# Patient Record
Sex: Female | Born: 1985
Health system: Southern US, Community
[De-identification: ages and names within clinical notes are randomized; demographics above are authoritative.]

## PROBLEM LIST (undated history)

## (undated) DIAGNOSIS — G971 Other reaction to spinal and lumbar puncture: Secondary | ICD-10-CM

## (undated) DIAGNOSIS — F419 Anxiety disorder, unspecified: Secondary | ICD-10-CM

## (undated) DIAGNOSIS — K589 Irritable bowel syndrome without diarrhea: Secondary | ICD-10-CM

## (undated) DIAGNOSIS — R87629 Unspecified abnormal cytological findings in specimens from vagina: Secondary | ICD-10-CM

## (undated) DIAGNOSIS — M329 Systemic lupus erythematosus, unspecified: Secondary | ICD-10-CM

## (undated) DIAGNOSIS — IMO0002 Reserved for concepts with insufficient information to code with codable children: Secondary | ICD-10-CM

## (undated) DIAGNOSIS — E079 Disorder of thyroid, unspecified: Secondary | ICD-10-CM

## (undated) DIAGNOSIS — E039 Hypothyroidism, unspecified: Secondary | ICD-10-CM

## (undated) HISTORY — PX: LEEP: SHX91

## (undated) HISTORY — DX: Unspecified abnormal cytological findings in specimens from vagina: R87.629

---

## 2007-05-01 ENCOUNTER — Ambulatory Visit (HOSPITAL_COMMUNITY): Admission: RE | Admit: 2007-05-01 | Discharge: 2007-05-01 | Payer: Self-pay | Admitting: Obstetrics and Gynecology

## 2007-08-21 ENCOUNTER — Inpatient Hospital Stay (HOSPITAL_COMMUNITY): Admission: AD | Admit: 2007-08-21 | Discharge: 2007-08-21 | Payer: Self-pay | Admitting: Obstetrics and Gynecology

## 2007-08-27 ENCOUNTER — Inpatient Hospital Stay (HOSPITAL_COMMUNITY): Admission: AD | Admit: 2007-08-27 | Discharge: 2007-08-27 | Payer: Self-pay | Admitting: Obstetrics and Gynecology

## 2007-11-14 ENCOUNTER — Inpatient Hospital Stay (HOSPITAL_COMMUNITY): Admission: AD | Admit: 2007-11-14 | Discharge: 2007-11-15 | Payer: Self-pay | Admitting: Obstetrics and Gynecology

## 2007-11-20 ENCOUNTER — Inpatient Hospital Stay (HOSPITAL_COMMUNITY): Admission: AD | Admit: 2007-11-20 | Discharge: 2007-11-23 | Payer: Self-pay | Admitting: Obstetrics and Gynecology

## 2010-11-12 ENCOUNTER — Encounter: Payer: Self-pay | Admitting: Obstetrics and Gynecology

## 2011-03-06 NOTE — H&P (Signed)
[name], [name]               ACCOUNT NO.:  [id number]   MEDICAL RECORD NO.:  [id number]          PATIENT TYPE:  INP   LOCATION:  9142                          FACILITY:  [facility]   PHYSICIAN:  Janine Limbo, M.D.DATE OF BIRTH:  1986-02-23   DATE OF ADMISSION:  11/20/2007  DATE OF DISCHARGE:                              HISTORY & PHYSICAL   This is a 25 year old gravida 2, para 0-1-0-0 at 40-4/7 weeks who  presents for induction of labor secondary to [facility].  Blood pressures were  140-160/90s in the office.  Pregnancy has been followed by the nurse  midwife service and remarkable for:  1. Smoker.  2. Rh negative.  3. First trimester UTI.  4. Migraines.  5. Group B strep positive.   ALLERGIES:  None.   OB HISTORY:  Remarkable for a spontaneous abortion in 2007.   PAST MEDICAL HISTORY:  Remarkable for childhood Varicella.  A history of  Trichomonas in the past.  History of migraines and depression, for which  she no longer uses medications.  Cigarette smoking.   PAST SURGICAL HISTORY:  Negative.   FAMILY HISTORY:  Remarkable for a father with hypertension.  Mother with  asthma.  Grandmother with diabetes.  Mother with lupus.  Mother with  depression.   GENETIC HISTORY:  Remarkable for father of the baby's grandmother with  missing digits.  Patient's mother with a hole in her heart.  A family  history of twins.   SOCIAL HISTORY:  Patient is married to [facility], who is involved and  supportive.  She is of the [place] faith.  She denies any alcohol,  tobacco, or drug use.   PRENATAL LABS:  Hemoglobin 12.7, platelets 239.  Blood type A negative-.  Antibody screen negative.  RPR nonreactive.  Rubella immune.  Hepatitis  negative.  HIV negative.  Pap test negative.  Gonorrhea negative.  Chlamydia negative.  Cystic fibrosis negative.   HISTORY OF CURRENT PREGNANCY:  Patient entered care at [redacted] weeks  gestation.  She had an ultrasound at 18 weeks that was normal.   Glucola  was normal.  Ultrasound done at 30 weeks, which was normal.  Group B  strep was positive at term.  She presents today with hypertension.   OBJECTIVE DATA:  Vital signs stable.  Afebrile.  Blood pressure is  150/90.  HEENT:  Within normal limits.  Thyroid normal, not enlarged.  CHEST:  Clear to auscultation.  HEART:  Regular rate and rhythm.  ABDOMEN:  Gravid.  Vertex Leopold's.  EFM shows reactive fetal heart  rate with occasional contractions.  Cervix was 280, -2, and vertex in the office, reflecting a Bishop's  score of 8.  EXTREMITIES:  Edema 1+ with normal DTRs.   PIH labs are pending.   ASSESSMENT:  1. Intrauterine pregnancy at 40-4/7 weeks.  2. Post dates.  3. Pregnancy-induced hypertension.  4. Favorable cervix.   PLAN:  1. Admit per Dr. Stefano Ward.  2. Routine CNM orders.  3. Pitocin induction of labor.   Risks and benefits were reviewed with the patient.      [name]  [name], C.N.M.      Janine Limbo, M.D.  Electronically Signed    MLW/MEDQ  D:  11/20/2007  T:  11/21/2007  Job:  [id number]

## 2011-07-12 LAB — COMPREHENSIVE METABOLIC PANEL
ALT: 11
AST: 18
Albumin: 2.5 — ABNORMAL LOW
Alkaline Phosphatase: 84
Calcium: 9.2
GFR calc Af Amer: >60
Potassium: 4.5
Sodium: 136
Total Protein: 6.1

## 2011-07-12 LAB — URINALYSIS, ROUTINE W REFLEX MICROSCOPIC
Bilirubin Urine: NEGATIVE
Ketones, ur: 15 — AB
Nitrite: NEGATIVE
Specific Gravity, Urine: 1.025
Urobilinogen, UA: 0.2
pH: 6

## 2011-07-12 LAB — CBC
HCT: 30.2 — ABNORMAL LOW
Hemoglobin: 10.5 — ABNORMAL LOW
MCHC: 34.3
MCV: 90.4
RBC: 3.34 — ABNORMAL LOW
RBC: 4.32
RDW: 14.3
WBC: 10.9 — ABNORMAL HIGH

## 2011-07-31 LAB — URINALYSIS, ROUTINE W REFLEX MICROSCOPIC
Glucose, UA: NEGATIVE
Hgb urine dipstick: NEGATIVE
Ketones, ur: NEGATIVE
pH: 6

## 2011-07-31 LAB — WET PREP, GENITAL
Trich, Wet Prep: NONE SEEN
Yeast Wet Prep HPF POC: NONE SEEN

## 2011-08-01 LAB — RH IMMUNE GLOBULIN WORKUP (NOT WOMEN'S HOSP): Antibody Screen: NEGATIVE

## 2011-08-02 ENCOUNTER — Other Ambulatory Visit (HOSPITAL_COMMUNITY): Payer: Self-pay | Admitting: Family Medicine

## 2011-08-02 ENCOUNTER — Ambulatory Visit (HOSPITAL_COMMUNITY)
Admission: RE | Admit: 2011-08-02 | Discharge: 2011-08-02 | Disposition: A | Discharge status: Home / Self Care | Payer: BC Managed Care – PPO | Source: Ambulatory Visit | Attending: Family Medicine | Admitting: Family Medicine

## 2011-08-02 DIAGNOSIS — Z975 Presence of (intrauterine) contraceptive device: Secondary | ICD-10-CM | POA: Insufficient documentation

## 2011-08-02 DIAGNOSIS — N949 Unspecified condition associated with female genital organs and menstrual cycle: Secondary | ICD-10-CM | POA: Insufficient documentation

## 2012-07-28 ENCOUNTER — Encounter (INDEPENDENT_AMBULATORY_CARE_PROVIDER_SITE_OTHER): Payer: Self-pay | Admitting: *Deleted

## 2012-08-18 ENCOUNTER — Ambulatory Visit (INDEPENDENT_AMBULATORY_CARE_PROVIDER_SITE_OTHER): Payer: BC Managed Care – PPO | Admitting: Internal Medicine

## 2012-08-25 ENCOUNTER — Telehealth: Payer: Self-pay

## 2012-08-25 ENCOUNTER — Ambulatory Visit (INDEPENDENT_AMBULATORY_CARE_PROVIDER_SITE_OTHER): Payer: BC Managed Care – PPO | Admitting: Obstetrics and Gynecology

## 2012-08-25 ENCOUNTER — Encounter: Payer: Self-pay | Admitting: Obstetrics and Gynecology

## 2012-08-25 VITALS — BP 122/78 | Temp 98.7°F | Ht 63.0 in | Wt 193.0 lb

## 2012-08-25 DIAGNOSIS — R102 Pelvic and perineal pain: Secondary | ICD-10-CM

## 2012-08-25 DIAGNOSIS — N926 Irregular menstruation, unspecified: Secondary | ICD-10-CM

## 2012-08-25 DIAGNOSIS — N949 Unspecified condition associated with female genital organs and menstrual cycle: Secondary | ICD-10-CM

## 2012-08-25 NOTE — Progress Notes (Signed)
HISTORY OF PRESENT ILLNESS  Ms. [name] is a 26 y.o. year old female,No obstetric history on file., who presents for a problem visit. The patient has had a Mirena IUD since 2009.  It is due to be removed next year.  She has been taking ciprofloxacin and Azo-Standard for what she thinks is urinary tract infection.  She has irregular cycles.  Subjective:  The patient complains of a vague pelvic discomfort that occurs even when she is not on her period.  This is better than prior to her IUD.  Her cycles are less heavy than prior to her IUD.  She complains of dyspareunia.  She has had some burning with urination.  Objective:  BP 122/78  Temp 98.7 F (37.1 C) (Oral)  Ht 5\' 3"  (1.6 m)  Wt 193 lb (87.544 kg)  BMI 34.19 kg/m2   General: no distress GI: soft and nontender  External genitalia: normal general appearance Vaginal: normal without tenderness, induration or masses and relaxation noted Cervix: normal appearance and IUD string visualized Adnexa: normal bimanual exam Uterus: normal size and consistency  Urine pregnancy test: Negative  Assessment:  Pelvic pain Dysuria Dyspareunia Irregular cycles Need for contraception  Plan:  The patient was told to complete her course of ciprofloxacin.  We will check a urine culture when she returns. Contraceptive options reviewed.  Risk and benefits discussed.  Patient will decide.  She was told to consider having another child if that is what she wants to do. Nexplanon information given.  Return to office in 2 week(s).   Leonard Schwartz M.D.  08/25/2012 7:10 PM    Vag. Discharge:no Odor:yes Fever:no Irreg.Periods:no Dyspareunia:no Dysuria:yes Frequency:yes Urgency:yes Hematuria:no Kidney stones:no Constipation:no Diarrhea:no Rectal Bleeding: no Vomiting:no Nausea:no Pregnant:no Fibroids:no Endometriosis:no Hx of Ovarian Cyst:yes Hx IUD:yes "Mirena" Hx STD-PID:no Appendectomy:no Gall Bladder  Dz:no

## 2012-08-25 NOTE — Telephone Encounter (Signed)
Pt already has appt today

## 2012-08-25 NOTE — Telephone Encounter (Signed)
Spoke to pt who thinks her IUD may be falling out. She has pain and pressure and cannot feel her strings. Pt also thinks she may have UTI sx's starting. Appt booked for today with Dr. Stefano Gaul. Melody Comas A

## 2012-12-15 ENCOUNTER — Encounter: Payer: BC Managed Care – PPO | Admitting: Obstetrics and Gynecology

## 2012-12-17 ENCOUNTER — Encounter: Payer: Self-pay | Admitting: Obstetrics and Gynecology

## 2012-12-17 ENCOUNTER — Ambulatory Visit: Payer: BC Managed Care – PPO | Admitting: Obstetrics and Gynecology

## 2012-12-17 ENCOUNTER — Telehealth: Payer: Self-pay | Admitting: Obstetrics and Gynecology

## 2012-12-17 VITALS — BP 112/72 | Wt 203.0 lb

## 2012-12-17 DIAGNOSIS — Z309 Encounter for contraceptive management, unspecified: Secondary | ICD-10-CM

## 2012-12-17 MED ORDER — AMBULATORY NON FORMULARY MEDICATION: 600.0000 mg | Status: DC

## 2012-12-17 NOTE — Telephone Encounter (Signed)
[name] called to [facility] in [place]. Pt aware.

## 2012-12-17 NOTE — Progress Notes (Signed)
26 YO for IUD removal and replacement contraception.  would also like to be checked for yeast.  O: Abdomen: soft, non-tender without masses  Pelvic: EGBUS-wnl, vagina-normal, cervix- IUD removed without difficulty, no lesions and without tenderness, uterus-normal size and adnexae-no tenderness or masses  Wet Prep:  pH-5  whiff-negative,  no clue, trich or yeast  A:  IUD Removal      Shift in Vaginal pH  P: Yaz  #1 1 po qd with BUM x 1 month starting with next menses 11 refills       BCP instruction sheet given       Boric Acid Suppositories 600 mg 1 pv twice a week x 4 weeks then prn 11 refills       RTO-as scheduled or prn  Ally Knodel, PA-C

## 2014-05-11 ENCOUNTER — Encounter (INDEPENDENT_AMBULATORY_CARE_PROVIDER_SITE_OTHER): Payer: Self-pay | Admitting: *Deleted

## 2014-06-15 ENCOUNTER — Institutional Professional Consult (permissible substitution) (INDEPENDENT_AMBULATORY_CARE_PROVIDER_SITE_OTHER): Payer: BC Managed Care – PPO | Admitting: Internal Medicine

## 2014-06-22 ENCOUNTER — Other Ambulatory Visit (HOSPITAL_COMMUNITY): Payer: Self-pay | Admitting: General Surgery

## 2014-06-22 DIAGNOSIS — R110 Nausea: Secondary | ICD-10-CM

## 2014-06-22 DIAGNOSIS — R1011 Right upper quadrant pain: Secondary | ICD-10-CM

## 2014-06-25 ENCOUNTER — Ambulatory Visit (HOSPITAL_COMMUNITY)
Admission: RE | Admit: 2014-06-25 | Discharge: 2014-06-25 | Disposition: A | Discharge status: Home / Self Care | Payer: BC Managed Care – PPO | Source: Ambulatory Visit | Attending: General Surgery | Admitting: General Surgery

## 2014-06-25 DIAGNOSIS — R110 Nausea: Secondary | ICD-10-CM

## 2014-06-25 DIAGNOSIS — R1011 Right upper quadrant pain: Secondary | ICD-10-CM | POA: Insufficient documentation

## 2014-08-23 ENCOUNTER — Encounter: Payer: Self-pay | Admitting: Obstetrics and Gynecology

## 2014-08-30 ENCOUNTER — Institutional Professional Consult (permissible substitution) (INDEPENDENT_AMBULATORY_CARE_PROVIDER_SITE_OTHER): Payer: BC Managed Care – PPO | Admitting: Internal Medicine

## 2015-12-28 ENCOUNTER — Emergency Department (HOSPITAL_COMMUNITY)
Admission: EM | Admit: 2015-12-28 | Discharge: 2015-12-29 | Disposition: A | Discharge status: Home / Self Care | Payer: BLUE CROSS/BLUE SHIELD | Attending: Emergency Medicine | Admitting: Emergency Medicine

## 2015-12-28 ENCOUNTER — Encounter (HOSPITAL_COMMUNITY): Payer: Self-pay

## 2015-12-28 DIAGNOSIS — Z87891 Personal history of nicotine dependence: Secondary | ICD-10-CM | POA: Insufficient documentation

## 2015-12-28 DIAGNOSIS — M436 Torticollis: Secondary | ICD-10-CM

## 2015-12-28 DIAGNOSIS — G44209 Tension-type headache, unspecified, not intractable: Secondary | ICD-10-CM

## 2015-12-28 DIAGNOSIS — H53149 Visual discomfort, unspecified: Secondary | ICD-10-CM | POA: Insufficient documentation

## 2015-12-28 DIAGNOSIS — R112 Nausea with vomiting, unspecified: Secondary | ICD-10-CM | POA: Insufficient documentation

## 2015-12-28 DIAGNOSIS — R51 Headache: Secondary | ICD-10-CM | POA: Diagnosis present

## 2015-12-28 NOTE — ED Notes (Signed)
Pt c/o pain to both sides of her neck that she states is now causing a headache as well.  Pt went to her pmd this morning and prescribed meds but she has not taken them.  Pt reports pain is worse with movement of her head

## 2015-12-29 MED ORDER — DIPHENHYDRAMINE HCL 50 MG/ML IJ SOLN
25.0000 mg | Freq: Once | INTRAMUSCULAR | Status: AC
  Administered 2015-12-29: 25 mg via INTRAVENOUS
  Filled 2015-12-29: qty 1

## 2015-12-29 MED ORDER — SODIUM CHLORIDE 0.9 % IV SOLN
1000.0000 mL | Freq: Once | INTRAVENOUS | Status: AC
Start: 2015-12-29 — End: 2015-12-29
  Administered 2015-12-29: 1000 mL via INTRAVENOUS

## 2015-12-29 MED ORDER — OXYCODONE-ACETAMINOPHEN 5-325 MG PO TABS: 1.0000 | ORAL_TABLET | ORAL | Status: DC | PRN

## 2015-12-29 MED ORDER — ORPHENADRINE CITRATE ER 100 MG PO TB12: 100.0000 mg | ORAL_TABLET | Freq: Two times a day (BID) | ORAL | Status: DC

## 2015-12-29 MED ORDER — DEXAMETHASONE SODIUM PHOSPHATE 10 MG/ML IJ SOLN
10.0000 mg | Freq: Once | INTRAMUSCULAR | Status: AC
  Administered 2015-12-29: 10 mg via INTRAVENOUS
  Filled 2015-12-29: qty 1

## 2015-12-29 MED ORDER — SODIUM CHLORIDE 0.9 % IV SOLN
1000.0000 mL | INTRAVENOUS | Status: DC
  Administered 2015-12-29: 1000 mL via INTRAVENOUS

## 2015-12-29 MED ORDER — METOCLOPRAMIDE HCL 5 MG/ML IJ SOLN
10.0000 mg | Freq: Once | INTRAMUSCULAR | Status: AC
  Administered 2015-12-29: 10 mg via INTRAVENOUS
  Filled 2015-12-29: qty 2

## 2015-12-29 NOTE — ED Provider Notes (Signed)
CSN: [id number]     Arrival date & time 12/28/15  2321 History  By signing my name below, I, Linus GalasMaharshi Patel, attest that this documentation has been prepared under the direction and in the presence of Dione Boozeavid Tajah Noguchi, MD. Electronically Signed: Linus GalasMaharshi Patel, ED Scribe. 12/29/2015. 12:32 AM.   Chief Complaint  Patient presents with  . Neck Pain  . Headache   Patient is a 30 y.o. female presenting with headaches. The history is provided by the patient. No language interpreter was used.  Headache Associated symptoms: nausea, neck pain, photophobia and vomiting   Associated symptoms: no congestion, no dizziness, no fever and no myalgias    HPI Comments: [name] is a 30 y.o. female with no PMHx who presents to the Emergency Department complaining of sharp neck pain  that began 2 days ago. Pt also reports nausea, vomiting, photophobia, and increased sensitivity to sounds and smells. Pt was seen by PCP for left sided neck pain who advised the pt to visit the ED for worsening pain. Since then, the pt has had pain on both sides of her neck that radiates down her back.  Pt rates her pain an 8/10 in severity. Worsening pain with movement. No alleviating factors. PCP prescribed pt medications but she has not taken them. Pt denies any other symptoms at this time.   Pt has a blood patch during her pregnancy.   History reviewed. No pertinent past medical history. History reviewed. No pertinent past surgical history. Family History  Problem Relation Age of Onset  . Hypertension Father    Social History  Substance Use Topics  . Smoking status: Former Games developermoker  . Smokeless tobacco: Never Used  . Alcohol Use: No   OB History    Gravida Para Term Preterm AB TAB SAB Ectopic Multiple Living   1         1     Review of Systems  Constitutional: Negative for fever and chills.  HENT: Negative for congestion and rhinorrhea.   Eyes: Positive for photophobia. Negative for redness and visual disturbance.   Respiratory: Negative for shortness of breath and wheezing.   Cardiovascular: Negative for chest pain and palpitations.  Gastrointestinal: Positive for nausea and vomiting.  Genitourinary: Negative for dysuria and urgency.  Musculoskeletal: Positive for neck pain. Negative for myalgias and arthralgias.  Skin: Negative for pallor and wound.  Neurological: Positive for headaches. Negative for dizziness.      Allergies  Review of patient's allergies indicates no known allergies.  Home Medications   Prior to Admission medications   Medication Sig Start Date End Date Taking? Authorizing Provider  citalopram (CELEXA) 20 MG tablet Take 20 mg by mouth daily.   Yes Historical Provider, MD  drospirenone-ethinyl estradiol (YAZ,GIANVI,LORYNA) 3-0.02 MG tablet Take 1 tablet by mouth daily.   Yes Historical Provider, MD  AMBULATORY NON FORMULARY MEDICATION Place 600 mg vaginally 2 (two) times a week. Medication Name: Boric Acid Suppositories 600 mg  #30  1 pv twice weekly x 4 weeks then prn 12/17/12   Henreitta LeberElmira Powell, PA-C  levonorgestrel (MIRENA) 20 MCG/24HR IUD 1 each by Intrauterine route once.    Historical Provider, MD   BP 142/102 mmHg  Pulse 86  Temp(Src) 98.2 F (36.8 C) (Oral)  Resp 20  Ht 5\' 3"  (1.6 m)  Wt 165 lb (74.844 kg)  BMI 29.24 kg/m2  SpO2 100%  LMP 12/19/2015 Physical Exam  Constitutional: She is oriented to person, place, and time. She appears well-developed and  well-nourished.  HENT:  Head: Normocephalic and atraumatic.  Mild tenderness over temporalis muscle bilaterally  Eyes: EOM are normal. Pupils are equal, round, and reactive to light.  Fundi normal   Neck: Normal range of motion. Neck supple. No JVD present.  Mild bilateral paracervical muscle spasm, tenderness of paracervical muscle extending to occiput and upper thoracic   Cardiovascular: Normal rate, regular rhythm and normal heart sounds.   No murmur heard. Pulmonary/Chest: Effort normal and breath sounds  normal. She has no wheezes. She has no rales. She exhibits no tenderness.  Abdominal: Soft. Bowel sounds are normal. She exhibits no distension and no mass. There is no tenderness.  Musculoskeletal: Normal range of motion. She exhibits no edema.  Lymphadenopathy:    She has no cervical adenopathy.  Neurological: She is alert and oriented to person, place, and time. No cranial nerve deficit. She exhibits normal muscle tone. Coordination normal.  Skin: Skin is warm and dry. No rash noted.  Psychiatric: She has a normal mood and affect. Her behavior is normal. Judgment and thought content normal.  Nursing note and vitals reviewed.   ED Course  Procedures   DIAGNOSTIC STUDIES: Oxygen Saturation is 100% on room air, normal by my interpretation.    COORDINATION OF CARE: 12:46 AM Will give fluids, reglan, benadryl and decadron. Discussed treatment plan with pt at bedside and pt agreed to plan.    MDM   Final diagnoses:  Torticollis, acquired  Muscle contraction headache    Headache weren't characteristic strongly suggestive of muscle contraction headache. No red flags to suggest more serious causes of headache. Initial neck pain and spasm consistent with torticollis and this seems to be what precipitated the headache. She is given a headache cocktail of IV fluids, metoclopramide, diphenhydramine, and dexamethasone. Following this, she had significant but not complete relief of her headache. She is discharged with prescription for orphenadrine and is given a small number of oxycodone-acetaminophen. Follow-up with PCP as needed.  I personally performed the services described in this documentation, which was scribed in my presence. The recorded information has been reviewed and is accurate.      Dione Booze, MD 12/29/15 [id number]

## 2015-12-29 NOTE — Discharge Instructions (Signed)
Take acetaminophen or ibuprofen as needed. Apply ice to your neck and temples as needed.   Acute Torticollis Torticollis is a condition in which the muscles of the neck tighten (contract) abnormally, causing the neck to twist and the head to move into an unnatural position. Torticollis that develops suddenly is called acute torticollis. If torticollis becomes chronic and is left untreated, the face and neck can become deformed. CAUSES This condition may be caused by:  Sleeping in an awkward position (common).  Extending or twisting the neck muscles beyond their normal position.  Infection. In some cases, the cause may not be known. SYMPTOMS Symptoms of this condition include:  An unnatural position of the head.  Neck pain.  A limited ability to move the neck.  Twisting of the neck to one side. DIAGNOSIS This condition is diagnosed with a physical exam. You may also have imaging tests, such as an X-ray, CT scan, or MRI. TREATMENT Treatment for this condition involves trying to relax the neck muscles. It may include:  Medicines or shots.  Physical therapy.  Surgery. This may be done in severe cases. HOME CARE INSTRUCTIONS  Take medicines only as directed by your health care provider.  Do stretching exercises and massage your neck as directed by your health care provider.  Keep all follow-up visits as directed by your health care provider. This is important. SEEK MEDICAL CARE IF:  You develop a fever. SEEK IMMEDIATE MEDICAL CARE IF:  You develop difficulty breathing.  You develop noisy breathing (stridor).  You start drooling.  You have trouble swallowing or have pain with swallowing.  You develop numbness or weakness in your hands or feet.  You have changes in your speech, understanding, or vision.  Your pain gets worse.   This information is not intended to replace advice given to you by your health care provider. Make sure you discuss any questions you have  with your health care provider.   Document Released: 10/05/2000 Document Revised: 02/22/2015 Document Reviewed: 10/04/2014 Elsevier Interactive Patient Education 2016 Elsevier Inc.   Tension Headache A tension headache is pain, pressure, or aching that is felt over the front and sides of your head. These headaches can last from 30 minutes to several days. HOME CARE Managing Pain  Take over-the-counter and prescription medicines only as told by your doctor.  Lie down in a dark, quiet room when you have a headache.  If directed, apply ice to your head and neck area:  Put ice in a plastic bag.  Place a towel between your skin and the bag.  Leave the ice on for 20 minutes, 2-3 times per day.  Use a heating pad or a hot shower to apply heat to your head and neck area as told by your doctor. Eating and Drinking  Eat meals on a regular schedule.  Do not drink a lot of alcohol.  Do not use a lot of caffeine, or stop using caffeine. General Instructions  Keep all follow-up visits as told by your doctor. This is important.  Keep a journal to find out if certain things bring on headaches. For example, write down:  What you eat and drink.  How much sleep you get.  Any change to your diet or medicines.  Try getting a massage, or doing other things that help you to relax.  Lessen stress.  Sit up straight. Do not tighten (tense) your muscles.  Do not use tobacco products. This includes cigarettes, chewing tobacco, or e-cigarettes. If you  need help quitting, ask your doctor.  Exercise regularly as told by your doctor.  Get enough sleep. This may mean 7-9 hours of sleep. GET HELP IF:  Your symptoms are not helped by medicine.  You have a headache that feels different from your usual headache.  You feel sick to your stomach (nauseous) or you throw up (vomit).  You have a fever. GET HELP RIGHT AWAY IF:  Your headache becomes very bad.  You keep throwing up.  You  have a stiff neck.  You have trouble seeing.  You have trouble speaking.  You have pain in your eye or ear.  Your muscles are weak or you lose muscle control.  You lose your balance or you have trouble walking.  You feel like you will pass out (faint) or you pass out.  You have confusion.   This information is not intended to replace advice given to you by your health care provider. Make sure you discuss any questions you have with your health care provider.   Document Released: 01/02/2010 Document Revised: 06/29/2015 Document Reviewed: 01/31/2015 Elsevier Interactive Patient Education 2016 Elsevier Inc.  Orphenadrine tablets What is this medicine? ORPHENADRINE (or FEN a dreen) helps to relieve pain and stiffness in muscles and can treat muscle spasms. This medicine may be used for other purposes; ask your health care provider or pharmacist if you have questions. What should I tell my health care provider before I take this medicine? They need to know if you have any of these conditions: -glaucoma -heart disease -kidney disease -myasthenia gravis -peptic ulcer disease -prostate disease -stomach problems -an unusual or allergic reaction to orphenadrine, other medicines, foods, lactose, dyes, or preservatives -pregnant or trying to get pregnant -breast-feeding How should I use this medicine? Take this medicine by mouth with a full glass of water. Follow the directions on the prescription label. Take your medicine at regular intervals. Do not take your medicine more often than directed. Do not take more than you are told to take. Talk to your pediatrician regarding the use of this medicine in children. Special care may be needed. Patients over 44 years old may have a stronger reaction and need a smaller dose. Overdosage: If you think you have taken too much of this medicine contact a poison control center or emergency room at once. NOTE: This medicine is only for you. Do not  share this medicine with others. What if I miss a dose? If you miss a dose, take it as soon as you can. If it is almost time for your next dose, take only that dose. Do not take double or extra doses. What may interact with this medicine? -alcohol -antihistamines -barbiturates, like phenobarbital -benzodiazepines -cyclobenzaprine -medicines for pain -phenothiazines like chlorpromazine, mesoridazine, prochlorperazine, thioridazine This list may not describe all possible interactions. Give your health care provider a list of all the medicines, herbs, non-prescription drugs, or dietary supplements you use. Also tell them if you smoke, drink alcohol, or use illegal drugs. Some items may interact with your medicine. What should I watch for while using this medicine? Your mouth may get dry. Chewing sugarless gum or sucking hard candy, and drinking plenty of water may help. Contact your doctor if the problem does not go away or is severe. This medicine may cause dry eyes and blurred vision. If you wear contact lenses you may feel some discomfort. Lubricating drops may help. See your eye doctor if the problem does not go away or is severe. You  may get drowsy or dizzy. Do not drive, use machinery, or do anything that needs mental alertness until you know how this medicine affects you. Do not stand or sit up quickly, especially if you are an older patient. This reduces the risk of dizzy or fainting spells. Alcohol may interfere with the effect of this medicine. Avoid alcoholic drinks. What side effects may I notice from receiving this medicine? Side effects that you should report to your doctor or health care professional as soon as possible: -allergic reactions like skin rash, itching or hives, swelling of the face, lips, or tongue -changes in vision -difficulty breathing -fast heartbeat or palpitations -hallucinations -light headedness, fainting spells -vomiting Side effects that usually do not  require medical attention (report to your doctor or health care professional if they continue or are bothersome): -dizziness -drowsiness -headache -nausea This list may not describe all possible side effects. Call your doctor for medical advice about side effects. You may report side effects to FDA at 1-800-FDA-1088. Where should I keep my medicine? Keep out of the reach of children. This medicine may cause accidental overdose and death if it taken by other adults, children, or pets. Mix any unused medicine with a substance like cat litter or coffee grounds. Then throw the medicine away in a sealed container like a sealed bag or a coffee can with a lid. Do not use the medicine after the expiration date. Store at room temperature between 15 and 30 degrees C (59 and 86 degrees F). NOTE: This sheet is a summary. It may not cover all possible information. If you have questions about this medicine, talk to your doctor, pharmacist, or health care provider.    2016, Elsevier/Gold Standard. (2013-12-04 15:35:08)

## 2015-12-29 NOTE — ED Notes (Signed)
Pt sleeping.   No distress noted.

## 2016-01-02 MED FILL — Oxycodone w/ Acetaminophen Tab 5-325 MG: ORAL | Qty: 6 | Status: AC

## 2016-01-04 ENCOUNTER — Ambulatory Visit: Payer: Self-pay | Admitting: "Endocrinology

## 2016-02-24 LAB — TSH: TSH: 1.59 u[IU]/mL (ref <=5.90)

## 2016-03-14 ENCOUNTER — Ambulatory Visit (INDEPENDENT_AMBULATORY_CARE_PROVIDER_SITE_OTHER): Payer: BLUE CROSS/BLUE SHIELD | Admitting: "Endocrinology

## 2016-03-14 ENCOUNTER — Encounter: Payer: Self-pay | Admitting: "Endocrinology

## 2016-03-14 VITALS — BP 112/79 | HR 71 | Ht 63.0 in | Wt 172.0 lb

## 2016-03-14 DIAGNOSIS — Z720 Tobacco use: Secondary | ICD-10-CM | POA: Diagnosis not present

## 2016-03-14 DIAGNOSIS — E063 Autoimmune thyroiditis: Secondary | ICD-10-CM | POA: Insufficient documentation

## 2016-03-14 DIAGNOSIS — M329 Systemic lupus erythematosus, unspecified: Secondary | ICD-10-CM

## 2016-03-14 DIAGNOSIS — F17200 Nicotine dependence, unspecified, uncomplicated: Secondary | ICD-10-CM | POA: Insufficient documentation

## 2016-03-14 DIAGNOSIS — IMO0002 Reserved for concepts with insufficient information to code with codable children: Secondary | ICD-10-CM | POA: Insufficient documentation

## 2016-03-14 NOTE — Progress Notes (Signed)
Subjective:    Patient ID: [name], female    DOB: Aug 22, 1986, PCP Purvis Kilts, MD   PMH: Hashimoto's thyroiditis , no hypothyroidism. Lupus.  Social History   Social History  . Marital Status: Married    Spouse Name: N/A  . Number of Children: N/A  . Years of Education: N/A   Social History Main Topics  . Smoking status: Former Research scientist (life sciences)  . Smokeless tobacco: Never Used  . Alcohol Use: No  . Drug Use: No  . Sexual Activity:    Partners: Male    Birth Control/ Protection: IUD     Comment: mirena   Other Topics Concern  . None   Social History Narrative   Outpatient Encounter Prescriptions as of 03/14/2016  Medication Sig  . drospirenone-ethinyl estradiol (YAZ,GIANVI,LORYNA) 3-0.02 MG tablet Take 1 tablet by mouth daily.  . [DISCONTINUED] AMBULATORY NON FORMULARY MEDICATION Place 600 mg vaginally 2 (two) times a week. Medication Name: Boric Acid Suppositories 600 mg  #30  1 pv twice weekly x 4 weeks then prn  . [DISCONTINUED] citalopram (CELEXA) 20 MG tablet Take 20 mg by mouth daily.  . [DISCONTINUED] levonorgestrel (MIRENA) 20 MCG/24HR IUD 1 each by Intrauterine route once.  . [DISCONTINUED] orphenadrine (NORFLEX) 100 MG tablet Take 1 tablet (100 mg total) by mouth 2 (two) times daily.  . [DISCONTINUED] oxyCODONE-acetaminophen (PERCOCET) 5-325 MG tablet Take 1 tablet by mouth every 4 (four) hours as needed for moderate pain.   No facility-administered encounter medications on file as of 03/14/2016.   ALLERGIES: No Known Allergies VACCINATION STATUS:  There is no immunization history on file for this patient.  HPI  30 year old female with medical history as above. She says she was diagnosed with lupus since her last visit. She is here to follow-up for her Hashimoto's thyroiditis. She is not on any thyroid hormone replacement. She has gained approximately 5 pounds since her last visit. She states that she was given topiramate for migraine headache in  the interim which caused some side effects and she had to stop it. She relates that she gained the weight because of that.  Review of Systems Constitutional: She gained 5 pounds since last visit, no fatigue, no subjective hyperthermia/hypothermia Eyes: no blurry vision, no xerophthalmia ENT: no sore throat, no nodules palpated in throat, no dysphagia/odynophagia, no hoarseness Cardiovascular: no CP/SOB/palpitations/leg swelling Respiratory: no cough/SOB Gastrointestinal: no N/V/D/C Musculoskeletal: no muscle/joint aches Skin: no rashes Neurological: no tremors/numbness/tingling/dizziness Psychiatric: no depression/anxiety  Objective:    BP 112/79 mmHg  Pulse 71  Ht 5' 3"  (1.6 m)  Wt 172 lb (78.019 kg)  BMI 30.48 kg/m2  SpO2 96%  Wt Readings from Last 3 Encounters:  03/14/16 172 lb (78.019 kg)  12/28/15 165 lb (74.844 kg)  12/17/12 203 lb (92.08 kg)    Physical Exam Constitutional: overweight, in NAD Eyes: PERRLA, EOMI, no exophthalmos ENT: moist mucous membranes, no thyromegaly, no cervical lymphadenopathy Cardiovascular: RRR, No MRG Respiratory: CTA B Gastrointestinal: abdomen soft, NT, ND, BS+ Musculoskeletal: no deformities, strength intact in all 4 Skin: moist, warm, no rashes Neurological: no tremor with outstretched hands, DTR normal in all 4  CMP     Component Value Date/Time   NA 136 11/20/2007 1706   K 4.5 11/20/2007 1706   CL 105 11/20/2007 1706   CO2 21 11/20/2007 1706   GLUCOSE 108* 11/20/2007 1706   BUN 9 11/20/2007 1706   CREATININE 0.66 11/20/2007 1706   CALCIUM 9.2 11/20/2007 1706  PROT 6.1 11/20/2007 1706   ALBUMIN 2.5* 11/20/2007 1706   AST 18 11/20/2007 1706   ALT 11 11/20/2007 1706   ALKPHOS 84 11/20/2007 1706   BILITOT 0.6 11/20/2007 1706   GFRNONAA >60 11/20/2007 1706   GFRAA  11/20/2007 1706    >60        The eGFR has been calculated using the MDRD equation. This calculation has not been validated in all clinical   02/24/2016: TSH  1.59, free T4 1.1, vitamin D 34   Assessment & Plan:   1. Hashimoto's thyroiditis -Her thyroid function tests are still within normal limits. She will not need thyroid hormone initiation at this point. She will need thyroid function test in 6 month.  -I have counseled her extensively for smoking cessation.  - Regarding her weight I discussed lifestyle modification including carbs management, exercise regimen.  - I advised patient to maintain close follow up with Purvis Kilts, MD for primary care needs. Follow up plan: Return in about 6 months (around 09/14/2016) for follow up with pre-visit labs.  Glade Lloyd, MD Phone: [phone]  Fax: [phone]   03/14/2016, 10:51 AM

## 2016-09-12 ENCOUNTER — Other Ambulatory Visit: Payer: Self-pay | Admitting: "Endocrinology

## 2016-09-12 LAB — TSH: TSH: 2.37 mIU/L

## 2016-09-12 LAB — T4, FREE: Free T4: 1.1 ng/dL (ref 0.8–1.8)

## 2016-09-17 ENCOUNTER — Encounter: Payer: Self-pay | Admitting: "Endocrinology

## 2016-09-17 ENCOUNTER — Ambulatory Visit (INDEPENDENT_AMBULATORY_CARE_PROVIDER_SITE_OTHER): Payer: BLUE CROSS/BLUE SHIELD | Admitting: "Endocrinology

## 2016-09-17 VITALS — BP 118/80 | HR 65 | Ht 63.0 in | Wt 180.0 lb

## 2016-09-17 DIAGNOSIS — E038 Other specified hypothyroidism: Secondary | ICD-10-CM | POA: Diagnosis not present

## 2016-09-17 DIAGNOSIS — E063 Autoimmune thyroiditis: Secondary | ICD-10-CM

## 2016-09-17 MED ORDER — LEVOTHYROXINE SODIUM 25 MCG PO CAPS: 25.0000 ug | ORAL_CAPSULE | Freq: Every day | ORAL | 3 refills | Status: DC

## 2016-09-17 NOTE — Progress Notes (Signed)
Subjective:    Patient ID: Holly RushJessica E Ward, female    DOB: Feb 08, 1986, PCP Colette RibasGOLDING, JOHN CABOT, MD   PMH: Hashimoto's thyroiditis , hypothyroidism. Lupus.  Social History   Social History  . Marital status: Married    Spouse name: N/A  . Number of children: N/A  . Years of education: N/A   Social History Main Topics  . Smoking status: Former Games developermoker  . Smokeless tobacco: Never Used  . Alcohol use No  . Drug use: No  . Sexual activity: Yes    Partners: Male    Birth control/ protection: IUD     Comment: mirena   Other Topics Concern  . None   Social History Narrative  . None   Outpatient Encounter Prescriptions as of 09/17/2016  Medication Sig  . drospirenone-ethinyl estradiol (YAZ,GIANVI,LORYNA) 3-0.02 MG tablet Take 1 tablet by mouth daily.  . Levothyroxine Sodium 25 MCG CAPS Take 1 capsule (25 mcg total) by mouth daily before breakfast.   No facility-administered encounter medications on file as of 09/17/2016.    ALLERGIES: No Known Allergies VACCINATION STATUS:  There is no immunization history on file for this patient.  HPI  30 year old female with medical history as above. She is here to follow-up for her Hashimoto's thyroiditis, with repeat thyroid function test. She is not on any thyroid hormone replacement. She has gained approximately 15 pounds since March 2017.Marland Kitchen. She states that she was given topiramate for migraine headache in the interim which caused some side effects and she had to stop it.  - She  quit smoking  since last visit.   Review of Systems Constitutional: She gained 15 pounds since March 2017 , no fatigue, no subjective hyperthermia/hypothermia Eyes: no blurry vision, no xerophthalmia ENT: no sore throat, no nodules palpated in throat, no dysphagia/odynophagia, no hoarseness Cardiovascular: no CP/SOB/palpitations/leg swelling Respiratory: no cough/SOB Gastrointestinal: no N/V/D/C Musculoskeletal: no muscle/joint aches Skin: no  rashes Neurological: no tremors/numbness/tingling/dizziness Psychiatric: no depression/anxiety  Objective:    BP 118/80   Pulse 65   Ht 5\' 3"  (1.6 m)   Wt 180 lb (81.6 kg)   BMI 31.89 kg/m   Wt Readings from Last 3 Encounters:  09/17/16 180 lb (81.6 kg)  03/14/16 172 lb (78 kg)  12/28/15 165 lb (74.8 kg)    Physical Exam Constitutional: overweight, in NAD Eyes: PERRLA, EOMI, no exophthalmos ENT: moist mucous membranes, no thyromegaly, no cervical lymphadenopathy Cardiovascular: RRR, No MRG Respiratory: CTA B Gastrointestinal: abdomen soft, NT, ND, BS+ Musculoskeletal: no deformities, strength intact in all 4 Skin: moist, warm, no rashes Neurological: no tremor with outstretched hands, DTR normal in all 4  Recent Results (from the past 2160 hour(s))  TSH     Status: None   Collection Time: 09/12/16 11:36 AM  Result Value Ref Range   TSH 2.37 mIU/L    Comment:   Reference Range   > or = 20 Years  0.40-4.50   Pregnancy Range First trimester  0.26-2.66 Second trimester 0.55-2.73 Third trimester  0.43-2.91     T4, free     Status: None   Collection Time: 09/12/16 11:36 AM  Result Value Ref Range   Free T4 1.1 0.8 - 1.8 ng/dL     Assessment & Plan:   Hypothyroidism due to Hashimoto's thyroiditis - Based on her clinical progress and background Hashimoto's thyroiditis, she would benefit from initiation of low-dose thyroid hormone. I would initiate levothyroxine 25 g by mouth every morning.  - We discussed about  correct intake of levothyroxine, at fasting, with water, separated by at least 30 minutes from breakfast, and separated by more than 4 hours from calcium, iron, multivitamins, acid reflux medications (PPIs). -Patient is made aware of the fact that thyroid hormone replacement is needed for life, dose to be adjusted by periodic monitoring of thyroid function tests.  - Regarding her weight I discussed lifestyle modification including carbs management, exercise  regimen.  - I advised patient to maintain close follow up with Colette RibasGOLDING, JOHN CABOT, MD for primary care needs. Follow up plan: Return in about 3 months (around 12/18/2016) for follow up with pre-visit labs.  Marquis LunchGebre Joyclyn Plazola, MD Phone: [phone]  Fax: [phone]   09/17/2016, 9:01 AM

## 2016-09-18 ENCOUNTER — Other Ambulatory Visit: Payer: Self-pay

## 2016-09-18 MED ORDER — LEVOTHYROXINE SODIUM 25 MCG PO TABS: 25.0000 ug | ORAL_TABLET | Freq: Every day | ORAL | 3 refills | Status: DC

## 2016-12-17 ENCOUNTER — Telehealth (INDEPENDENT_AMBULATORY_CARE_PROVIDER_SITE_OTHER): Payer: Self-pay | Admitting: Rheumatology

## 2016-12-17 NOTE — Telephone Encounter (Signed)
SRS NOTE FAXED TO DR FUSCO/REFERRING MD [phone]

## 2016-12-21 ENCOUNTER — Ambulatory Visit: Payer: BLUE CROSS/BLUE SHIELD | Admitting: "Endocrinology

## 2017-01-03 ENCOUNTER — Other Ambulatory Visit: Payer: Self-pay | Admitting: "Endocrinology

## 2017-01-03 LAB — TSH: TSH: 1.82 mIU/L

## 2017-01-03 LAB — T4, FREE: Free T4: 1 ng/dL (ref 0.8–1.8)

## 2017-01-10 ENCOUNTER — Ambulatory Visit (INDEPENDENT_AMBULATORY_CARE_PROVIDER_SITE_OTHER): Payer: BLUE CROSS/BLUE SHIELD | Admitting: "Endocrinology

## 2017-01-10 ENCOUNTER — Encounter: Payer: Self-pay | Admitting: "Endocrinology

## 2017-01-10 VITALS — BP 116/77 | HR 82 | Ht 63.0 in | Wt 182.0 lb

## 2017-01-10 DIAGNOSIS — E063 Autoimmune thyroiditis: Secondary | ICD-10-CM | POA: Diagnosis not present

## 2017-01-10 DIAGNOSIS — E038 Other specified hypothyroidism: Secondary | ICD-10-CM

## 2017-01-10 MED ORDER — LEVOTHYROXINE SODIUM 50 MCG PO TABS: 50.0000 ug | ORAL_TABLET | Freq: Every day | ORAL | 6 refills | Status: DC

## 2017-01-10 NOTE — Progress Notes (Signed)
Subjective:    Patient ID: [name], female    DOB: 1986/09/18, PCP Colette Ribas, MD   PMH: Hashimoto's thyroiditis , hypothyroidism. Lupus.  Social History   Social History  . Marital status: Married    Spouse name: N/A  . Number of children: N/A  . Years of education: N/A   Social History Main Topics  . Smoking status: Former Games developer  . Smokeless tobacco: Never Used  . Alcohol use No  . Drug use: No  . Sexual activity: Yes    Partners: Male    Birth control/ protection: IUD     Comment: mirena   Other Topics Concern  . None   Social History Narrative  . None   Outpatient Encounter Prescriptions as of 01/10/2017  Medication Sig  . drospirenone-ethinyl estradiol (YAZ,GIANVI,LORYNA) 3-0.02 MG tablet Take 1 tablet by mouth daily.  Marland Kitchen levothyroxine (SYNTHROID, LEVOTHROID) 25 MCG tablet Take 1 tablet (25 mcg total) by mouth daily before breakfast.   No facility-administered encounter medications on file as of 01/10/2017.    ALLERGIES: No Known Allergies VACCINATION STATUS:  There is no immunization history on file for this patient.  HPI  31 year old female with medical history as above. She is here to follow-up for her Hypothyroidism due to Hashimoto's thyroiditis, with repeat thyroid function test. She is on levothyroxine 25  by mouth every morning. She tolerated the medication. She has no new complaints today.   - She  quit smoking  since last visit.   Review of Systems Constitutional: She gained 4 pounds since last visit, no fatigue, no subjective hyperthermia/hypothermia Eyes: no blurry vision, no xerophthalmia ENT: no sore throat, no nodules palpated in throat, no dysphagia/odynophagia, no hoarseness Cardiovascular: no CP/SOB/palpitations/leg swelling Respiratory: no cough/SOB Gastrointestinal: no N/V/D/C Musculoskeletal: no muscle/joint aches Skin: no rashes Neurological: no tremors/numbness/tingling/dizziness Psychiatric: no  depression/anxiety  Objective:    BP 116/77   Pulse 82   Ht 5\' 3"  (1.6 m)   Wt 182 lb (82.6 kg)   BMI 32.24 kg/m   Wt Readings from Last 3 Encounters:  01/10/17 182 lb (82.6 kg)  09/17/16 180 lb (81.6 kg)  03/14/16 172 lb (78 kg)    Physical Exam Constitutional: overweight, in NAD Eyes: PERRLA, EOMI, no exophthalmos ENT: moist mucous membranes, no thyromegaly, no cervical lymphadenopathy Cardiovascular: RRR, No MRG Respiratory: CTA B Gastrointestinal: abdomen soft, NT, ND, BS+ Musculoskeletal: no deformities, strength intact in all 4 Skin: moist, warm, no rashes Neurological: no tremor with outstretched hands, DTR normal in all 4  Recent Results (from the past 2160 hour(s))  TSH     Status: None   Collection Time: 01/03/17 11:39 AM  Result Value Ref Range   TSH 1.82 mIU/L    Comment:   Reference Range   > or = 20 Years  0.40-4.50   Pregnancy Range First trimester  0.26-2.66 Second trimester 0.55-2.73 Third trimester  0.43-2.91     T4, free     Status: None   Collection Time: 01/03/17 11:39 AM  Result Value Ref Range   Free T4 1.0 0.8 - 1.8 ng/dL     Assessment & Plan:   Hypothyroidism due to Hashimoto's thyroiditis - Based on her clinical progress and background Hashimoto's thyroiditis, she will continue to benefit from levothyroxine.  I will increase  Levothyroxine to 50 g by mouth every morning.  - We discussed about correct intake of levothyroxine, at fasting, with water, separated by at least 30 minutes from breakfast,  and separated by more than 4 hours from calcium, iron, multivitamins, acid reflux medications (PPIs). -Patient is made aware of the fact that thyroid hormone replacement is needed for life, dose to be adjusted by periodic monitoring of thyroid function tests.  - Regarding her weight I discussed lifestyle modification including carbs management, exercise regimen.  - I advised patient to maintain close follow up with Colette RibasGOLDING, JOHN CABOT, MD  for primary care needs. Follow up plan: Return in about 6 months (around 07/13/2017) for follow up with pre-visit labs.  Marquis LunchGebre Tane Biegler, MD Phone: [phone]  Fax: [phone]   01/10/2017, 8:38 AM

## 2017-02-06 ENCOUNTER — Telehealth: Payer: Self-pay | Admitting: "Endocrinology

## 2017-02-06 NOTE — Telephone Encounter (Signed)
[name] is calling asking for a returned call, she has questions in regards to her Thyroid Medication Dosage because she is still gaining weight, please advise?

## 2017-02-06 NOTE — Telephone Encounter (Signed)
Pt is concerned because she has gained 6 lbs since 01-10-17 despite following the diet and exercising. She is questioning if she needs a medication increase.

## 2017-02-06 NOTE — Telephone Encounter (Signed)
Left message for pt to call back  °

## 2017-02-07 NOTE — Telephone Encounter (Signed)
Called pt. No answer. No voicemail.

## 2017-02-07 NOTE — Telephone Encounter (Signed)
Medication dose adjustment will only depend on results of her next  lab work, which is too soon to do. she may return in 8 weeks with repeat labs.

## 2017-03-04 ENCOUNTER — Telehealth: Payer: Self-pay

## 2017-03-04 NOTE — Telephone Encounter (Signed)
Pt.notified

## 2017-03-04 NOTE — Telephone Encounter (Signed)
Pt states that the pharmacy told her that the Levothyroxine can interfere with the effectiveness of her Devonne DoughtyLoryna?

## 2017-03-04 NOTE — Telephone Encounter (Signed)
It is the other way around, [name] may decrease the effect of levothyroxine. That is why she needs periodic test and visit.  During her last lab, we made adjustment on her thyroid hormone. No change now, will decide on her next blood test,

## 2017-07-09 LAB — TSH: TSH: 2.42 mIU/L

## 2017-07-09 LAB — T4, FREE: FREE T4: 1.1 ng/dL (ref 0.8–1.8)

## 2017-07-16 ENCOUNTER — Ambulatory Visit (INDEPENDENT_AMBULATORY_CARE_PROVIDER_SITE_OTHER): Payer: BLUE CROSS/BLUE SHIELD | Admitting: "Endocrinology

## 2017-07-16 ENCOUNTER — Encounter: Payer: Self-pay | Admitting: "Endocrinology

## 2017-07-16 VITALS — BP 128/84 | HR 65 | Ht 63.0 in | Wt 182.0 lb

## 2017-07-16 DIAGNOSIS — E038 Other specified hypothyroidism: Secondary | ICD-10-CM

## 2017-07-16 DIAGNOSIS — E063 Autoimmune thyroiditis: Secondary | ICD-10-CM

## 2017-07-16 MED ORDER — LEVOTHYROXINE SODIUM 75 MCG PO TABS: 75.0000 ug | ORAL_TABLET | Freq: Every day | ORAL | 6 refills | Status: DC

## 2017-07-16 NOTE — Progress Notes (Signed)
Subjective:    Patient ID: [name], female    DOB: 02/27/1986, PCP Assunta Found, MD   PMH: Hashimoto's thyroiditis , hypothyroidism. Lupus.  Social History   Social History  . Marital status: Married    Spouse name: N/A  . Number of children: N/A  . Years of education: N/A   Social History Main Topics  . Smoking status: Former Games developer  . Smokeless tobacco: Never Used  . Alcohol use No  . Drug use: No  . Sexual activity: Yes    Partners: Male    Birth control/ protection: IUD     Comment: mirena   Other Topics Concern  . None   Social History Narrative  . None   Outpatient Encounter Prescriptions as of 07/16/2017  Medication Sig  . drospirenone-ethinyl estradiol (YAZ,GIANVI,LORYNA) 3-0.02 MG tablet Take 1 tablet by mouth daily.  Marland Kitchen levothyroxine (SYNTHROID, LEVOTHROID) 75 MCG tablet Take 1 tablet (75 mcg total) by mouth daily before breakfast.  . [DISCONTINUED] levothyroxine (SYNTHROID, LEVOTHROID) 50 MCG tablet Take 1 tablet (50 mcg total) by mouth daily before breakfast.   No facility-administered encounter medications on file as of 07/16/2017.    ALLERGIES: No Known Allergies VACCINATION STATUS:  There is no immunization history on file for this patient.  HPI  31 year old female with medical history as above. She is here to follow-up for her Hypothyroidism due to Hashimoto's thyroiditis, with repeat thyroid function test. She is on levothyroxine 50 g by mouth every morning. She tolerated the medication. She has no new complaints today. She reports compliance to this medication.  - She  quit smoking.  Review of Systems Constitutional: + steady weight since last visit, no fatigue, no subjective hyperthermia/hypothermia Eyes: no blurry vision, no xerophthalmia ENT: no sore throat, no nodules palpated in throat, no dysphagia/odynophagia, no hoarseness Cardiovascular: no CP/SOB/palpitations/leg swelling Respiratory: no cough/SOB Gastrointestinal: no  N/V/D/C Musculoskeletal: no muscle/joint aches Skin: no rashes Neurological: no tremors/numbness/tingling/dizziness Psychiatric: no depression/anxiety  Objective:    BP 128/84   Pulse 65   Ht  (1.6 m)   Wt 182 lb (82.6 kg)   BMI 32.24 kg/m   Wt Readings from Last 3 Encounters:  07/16/17 182 lb (82.6 kg)  01/10/17 182 lb (82.6 kg)  09/17/16 180 lb (81.6 kg)    Physical Exam   Constitutional: overweight, in NAD Eyes: PERRLA, EOMI, no exophthalmos ENT: moist mucous membranes, no thyromegaly, no cervical lymphadenopathy Cardiovascular: RRR, No MRG Respiratory: CTA B Gastrointestinal: abdomen soft, NT, ND, BS+ Musculoskeletal: no deformities, strength intact in all 4 Skin: moist, warm, no rashes Neurological: no tremor with outstretched hands, DTR normal in all 4  Recent Results (from the past 2160 hour(s))  T4, free     Status: None   Collection Time: 07/09/17 11:22 AM  Result Value Ref Range   Free T4 1.1 0.8 - 1.8 ng/dL  TSH     Status: None   Collection Time: 07/09/17 11:22 AM  Result Value Ref Range   TSH 2.42 mIU/L    Comment:           Reference Range .           > or = 20 Years  0.40-4.50 .                Pregnancy Ranges           First trimester    0.26-2.66           Second trimester  0.55-2.73           Third trimester    0.43-2.91      Assessment & Plan:   Hypothyroidism due to Hashimoto's thyroiditis - Heart thyroid function profile is improving, however, she would benefit from a higher dose of levothyroxine. I will increase  Levothyroxine to 75 g by mouth every morning.  - We discussed about correct intake of levothyroxine, at fasting, with water, separated by at least 30 minutes from breakfast, and separated by more than 4 hours from calcium, iron, multivitamins, acid reflux medications (PPIs). -Patient is made aware of the fact that thyroid hormone replacement is needed for life, dose to be adjusted by periodic monitoring of thyroid  function tests.  - Regarding her weight I discussed lifestyle modification including carbs management, exercise regimen.  - I advised patient to maintain close follow up with Assunta Found, MD for primary care needs. Follow up plan: Return in about 6 months (around 01/13/2018) for follow up with pre-visit labs.  Marquis Lunch, MD Phone: [phone]  Fax: [phone]   This note was partially dictated with voice recognition software. Similar sounding words can be transcribed inadequately or may not  be corrected upon review.  07/16/2017, 9:30 AM

## 2017-09-24 DIAGNOSIS — Z760 Encounter for issue of repeat prescription: Secondary | ICD-10-CM | POA: Diagnosis not present

## 2017-09-24 DIAGNOSIS — Z113 Encounter for screening for infections with a predominantly sexual mode of transmission: Secondary | ICD-10-CM | POA: Diagnosis not present

## 2017-09-24 DIAGNOSIS — R87610 Atypical squamous cells of undetermined significance on cytologic smear of cervix (ASC-US): Secondary | ICD-10-CM | POA: Diagnosis not present

## 2017-09-24 DIAGNOSIS — Z6833 Body mass index (BMI) 33.0-33.9, adult: Secondary | ICD-10-CM | POA: Diagnosis not present

## 2017-09-24 DIAGNOSIS — Z3041 Encounter for surveillance of contraceptive pills: Secondary | ICD-10-CM | POA: Diagnosis not present

## 2017-09-24 DIAGNOSIS — Z01419 Encounter for gynecological examination (general) (routine) without abnormal findings: Secondary | ICD-10-CM | POA: Diagnosis not present

## 2017-09-24 DIAGNOSIS — Z124 Encounter for screening for malignant neoplasm of cervix: Secondary | ICD-10-CM | POA: Diagnosis not present

## 2017-10-07 DIAGNOSIS — R87610 Atypical squamous cells of undetermined significance on cytologic smear of cervix (ASC-US): Secondary | ICD-10-CM | POA: Diagnosis not present

## 2017-10-07 DIAGNOSIS — R87619 Unspecified abnormal cytological findings in specimens from cervix uteri: Secondary | ICD-10-CM | POA: Diagnosis not present

## 2017-10-17 DIAGNOSIS — R87610 Atypical squamous cells of undetermined significance on cytologic smear of cervix (ASC-US): Secondary | ICD-10-CM | POA: Diagnosis not present

## 2018-01-14 ENCOUNTER — Ambulatory Visit: Payer: BLUE CROSS/BLUE SHIELD | Admitting: "Endocrinology

## 2018-05-05 DIAGNOSIS — Z6832 Body mass index (BMI) 32.0-32.9, adult: Secondary | ICD-10-CM | POA: Diagnosis not present

## 2018-05-05 DIAGNOSIS — E6609 Other obesity due to excess calories: Secondary | ICD-10-CM | POA: Diagnosis not present

## 2018-05-05 DIAGNOSIS — R41840 Attention and concentration deficit: Secondary | ICD-10-CM | POA: Diagnosis not present

## 2018-05-05 DIAGNOSIS — N342 Other urethritis: Secondary | ICD-10-CM | POA: Diagnosis not present

## 2018-05-23 ENCOUNTER — Telehealth: Payer: Self-pay | Admitting: "Endocrinology

## 2018-05-23 NOTE — Telephone Encounter (Signed)
I scheduled this patient for follow up with Dr Fransico HimNida on 8/12 at 11:30am.  Please order lab work that is needed and let patient know when this is done and were to go for the labs prior to the appt.  Thanks

## 2018-05-27 ENCOUNTER — Other Ambulatory Visit: Payer: Self-pay | Admitting: "Endocrinology

## 2018-05-27 DIAGNOSIS — E039 Hypothyroidism, unspecified: Secondary | ICD-10-CM

## 2018-05-27 NOTE — Telephone Encounter (Signed)
Order sent.

## 2018-05-28 DIAGNOSIS — E039 Hypothyroidism, unspecified: Secondary | ICD-10-CM | POA: Diagnosis not present

## 2018-05-28 LAB — TSH: TSH: 3.06 mIU/L

## 2018-05-28 LAB — T4, FREE: Free T4: 1 ng/dL (ref 0.8–1.8)

## 2018-06-02 ENCOUNTER — Encounter: Payer: Self-pay | Admitting: "Endocrinology

## 2018-06-02 ENCOUNTER — Ambulatory Visit (INDEPENDENT_AMBULATORY_CARE_PROVIDER_SITE_OTHER): Payer: BLUE CROSS/BLUE SHIELD | Admitting: "Endocrinology

## 2018-06-02 VITALS — BP 126/84 | HR 71 | Ht 63.0 in | Wt 197.0 lb

## 2018-06-02 DIAGNOSIS — E063 Autoimmune thyroiditis: Secondary | ICD-10-CM

## 2018-06-02 DIAGNOSIS — E038 Other specified hypothyroidism: Secondary | ICD-10-CM | POA: Diagnosis not present

## 2018-06-02 MED ORDER — LEVOTHYROXINE SODIUM 88 MCG PO TABS: 88.0000 ug | ORAL_TABLET | Freq: Every day | ORAL | 2 refills | Status: DC

## 2018-06-02 NOTE — Progress Notes (Signed)
Endocrinology follow-up note   Subjective:    Patient ID: [name], female    DOB: 1986-03-20, PCP Assunta FoundGolding, John, MD   PMH: Hashimoto's thyroiditis , hypothyroidism. Lupus.  Social History   Socioeconomic History  . Marital status: Married    Spouse name: Not on file  . Number of children: Not on file  . Years of education: Not on file  . Highest education level: Not on file  Occupational History  . Not on file  Social Needs  . Financial resource strain: Not on file  . Food insecurity:    Worry: Not on file    Inability: Not on file  . Transportation needs:    Medical: Not on file    Non-medical: Not on file  Tobacco Use  . Smoking status: Former Games developermoker  . Smokeless tobacco: Never Used  Substance and Sexual Activity  . Alcohol use: No  . Drug use: No  . Sexual activity: Yes    Partners: Male    Birth control/protection: IUD    Comment: mirena  Lifestyle  . Physical activity:    Days per week: Not on file    Minutes per session: Not on file  . Stress: Not on file  Relationships  . Social connections:    Talks on phone: Not on file    Gets together: Not on file    Attends religious service: Not on file    Active member of club or organization: Not on file    Attends meetings of clubs or organizations: Not on file    Relationship status: Not on file  Other Topics Concern  . Not on file  Social History Narrative  . Not on file   Outpatient Encounter Medications as of 06/02/2018  Medication Sig  . drospirenone-ethinyl estradiol (YAZ,GIANVI,LORYNA) 3-0.02 MG tablet Take 1 tablet by mouth daily.  Marland Kitchen. levothyroxine (SYNTHROID, LEVOTHROID) 88 MCG tablet Take 1 tablet (88 mcg total) by mouth daily before breakfast.  . [DISCONTINUED] levothyroxine (SYNTHROID, LEVOTHROID) 75 MCG tablet Take 1 tablet (75 mcg total) by mouth daily before breakfast.   No facility-administered encounter medications on file as of 06/02/2018.    ALLERGIES: No Known  Allergies VACCINATION STATUS:  There is no immunization history on file for this patient.  HPI  32 year old female with medical history as above. She is here to follow-up for her Hypothyroidism due to Hashimoto's thyroiditis, with repeat thyroid function test. -She is currently on 75 mcg of levothyroxine daily.  She admits to missing some doses on and off.  She complains of progressive weight gain, mood swings.  - She tolerated the medication. - She  quit smoking.  Review of Systems Constitutional: + Gained  weight , no fatigue, no subjective hyperthermia/hypothermia Eyes: no blurry vision, no xerophthalmia ENT: no sore throat, no nodules palpated in throat, no dysphagia/odynophagia, no hoarseness Musculoskeletal: no muscle/joint aches Skin: no rashes Neurological: no tremors/numbness/tingling/dizziness Psychiatric: no depression/anxiety  Objective:    BP 126/84   Pulse 71   Ht 5\' 3"  (1.6 m)   Wt 197 lb (89.4 kg)   BMI 34.90 kg/m   Wt Readings from Last 3 Encounters:  06/02/18 197 lb (89.4 kg)  07/16/17 182 lb (82.6 kg)  01/10/17 182 lb (82.6 kg)    Physical Exam   Constitutional: overweight, in NAD Eyes: PERRLA, EOMI, no exophthalmos ENT: moist mucous membranes, no thyromegaly, no cervical lymphadenopathy Musculoskeletal: no deformities, strength intact in all 4 Skin: moist, warm, no rashes Neurological: no  tremor with outstretched hands   Recent Results (from the past 2160 hour(s))  T4, Free     Status: None   Collection Time: 05/28/18 11:42 AM  Result Value Ref Range   Free T4 1.0 0.8 - 1.8 ng/dL  TSH     Status: None   Collection Time: 05/28/18 11:42 AM  Result Value Ref Range   TSH 3.06 mIU/L    Comment:           Reference Range .           > or = 20 Years  0.40-4.50 .                Pregnancy Ranges           First trimester    0.26-2.66           Second trimester   0.55-2.73           Third trimester    0.43-2.91      Assessment & Plan:    Hypothyroidism due to Hashimoto's thyroiditis -Her previsit thyroid function tests are such that she will benefit from a higher dose of levothyroxine.  I discussed and increased her levothyroxine to 88 mcg p.o. every morning.     - We discussed about correct intake of levothyroxine, at fasting, with water, separated by at least 30 minutes from breakfast, and separated by more than 4 hours from calcium, iron, multivitamins, acid reflux medications (PPIs). -Patient is made aware of the fact that thyroid hormone replacement is needed for life, dose to be adjusted by periodic monitoring of thyroid function tests. - Regarding her weight I discussed lifestyle modification including carbs management, exercise regimen. -  Suggestion is made for her to avoid simple carbohydrates  from her diet including Cakes, Sweet Desserts / Pastries, Ice Cream, Soda (diet and regular), Sweet Tea, Candies, Chips, Cookies, Store Bought Juices, Alcohol in Excess of  1-2 drinks a day, Artificial Sweeteners, and "Sugar-free" Products. This will help patient to have stable blood glucose profile and potentially avoid unintended weight gain.   - I advised patient to maintain close follow up with Assunta FoundGolding, John, MD for primary care needs. Follow up plan: Return in about 3 months (around 09/02/2018) for Follow up with Pre-visit Labs.  Marquis LunchGebre Laymon Stockert, MD Phone: [phone]  Fax: [phone]   This note was partially dictated with voice recognition software. Similar sounding words can be transcribed inadequately or may not  be corrected upon review.  06/02/2018, 11:47 AM

## 2018-06-02 NOTE — Patient Instructions (Signed)

## 2018-09-09 ENCOUNTER — Ambulatory Visit: Payer: BLUE CROSS/BLUE SHIELD | Admitting: "Endocrinology

## 2018-10-23 DIAGNOSIS — Z01419 Encounter for gynecological examination (general) (routine) without abnormal findings: Secondary | ICD-10-CM | POA: Diagnosis not present

## 2018-10-23 DIAGNOSIS — Z3041 Encounter for surveillance of contraceptive pills: Secondary | ICD-10-CM | POA: Diagnosis not present

## 2018-10-23 DIAGNOSIS — R87610 Atypical squamous cells of undetermined significance on cytologic smear of cervix (ASC-US): Secondary | ICD-10-CM | POA: Diagnosis not present

## 2018-10-23 DIAGNOSIS — N390 Urinary tract infection, site not specified: Secondary | ICD-10-CM | POA: Diagnosis not present

## 2018-10-23 DIAGNOSIS — R309 Painful micturition, unspecified: Secondary | ICD-10-CM | POA: Diagnosis not present

## 2018-10-23 DIAGNOSIS — Z113 Encounter for screening for infections with a predominantly sexual mode of transmission: Secondary | ICD-10-CM | POA: Diagnosis not present

## 2018-10-23 DIAGNOSIS — Z124 Encounter for screening for malignant neoplasm of cervix: Secondary | ICD-10-CM | POA: Diagnosis not present

## 2018-10-30 DIAGNOSIS — G43909 Migraine, unspecified, not intractable, without status migrainosus: Secondary | ICD-10-CM | POA: Diagnosis not present

## 2019-02-03 DIAGNOSIS — R05 Cough: Secondary | ICD-10-CM | POA: Diagnosis not present

## 2019-02-06 ENCOUNTER — Encounter (HOSPITAL_COMMUNITY): Payer: Self-pay | Admitting: Emergency Medicine

## 2019-02-06 ENCOUNTER — Emergency Department (HOSPITAL_COMMUNITY)
Admission: EM | Admit: 2019-02-06 | Discharge: 2019-02-06 | Disposition: A | Discharge status: Home / Self Care | Payer: BLUE CROSS/BLUE SHIELD | Attending: Emergency Medicine | Admitting: Emergency Medicine

## 2019-02-06 ENCOUNTER — Emergency Department (HOSPITAL_COMMUNITY): Payer: BLUE CROSS/BLUE SHIELD

## 2019-02-06 ENCOUNTER — Other Ambulatory Visit: Payer: Self-pay

## 2019-02-06 DIAGNOSIS — Z87891 Personal history of nicotine dependence: Secondary | ICD-10-CM | POA: Insufficient documentation

## 2019-02-06 DIAGNOSIS — M3210 Systemic lupus erythematosus, organ or system involvement unspecified: Secondary | ICD-10-CM | POA: Insufficient documentation

## 2019-02-06 DIAGNOSIS — R079 Chest pain, unspecified: Secondary | ICD-10-CM | POA: Diagnosis not present

## 2019-02-06 DIAGNOSIS — R05 Cough: Secondary | ICD-10-CM | POA: Diagnosis not present

## 2019-02-06 DIAGNOSIS — R059 Cough, unspecified: Secondary | ICD-10-CM

## 2019-02-06 DIAGNOSIS — R0602 Shortness of breath: Secondary | ICD-10-CM | POA: Diagnosis not present

## 2019-02-06 HISTORY — DX: Systemic lupus erythematosus, unspecified: M32.9

## 2019-02-06 HISTORY — DX: Disorder of thyroid, unspecified: E07.9

## 2019-02-06 HISTORY — DX: Reserved for concepts with insufficient information to code with codable children: IMO0002

## 2019-02-06 LAB — URINALYSIS, ROUTINE W REFLEX MICROSCOPIC
Bilirubin Urine: NEGATIVE
Glucose, UA: NEGATIVE mg/dL
Hgb urine dipstick: NEGATIVE
Ketones, ur: NEGATIVE mg/dL
Leukocytes,Ua: NEGATIVE
Nitrite: NEGATIVE
Protein, ur: NEGATIVE mg/dL
Specific Gravity, Urine: 1.004 — ABNORMAL LOW (ref 1.005–1.030)
pH: 6 (ref 5.0–8.0)

## 2019-02-06 LAB — BASIC METABOLIC PANEL
Anion gap: 8 (ref 5–15)
BUN: 16 mg/dL (ref 6–20)
CO2: 22 mmol/L (ref 22–32)
Calcium: 9.1 mg/dL (ref 8.9–10.3)
Chloride: 108 mmol/L (ref 98–111)
Creatinine, Ser: 0.8 mg/dL (ref 0.44–1.00)
GFR calc Af Amer: >60 mL/min (ref >60)
GFR calc non Af Amer: >60 mL/min (ref >60)
Glucose, Bld: 126 mg/dL — ABNORMAL HIGH (ref 70–99)
Potassium: 4.8 mmol/L (ref 3.5–5.1)
Sodium: 138 mmol/L (ref 135–145)

## 2019-02-06 LAB — CBC WITH DIFFERENTIAL/PLATELET
Abs Immature Granulocytes: 0.02 10*3/uL (ref 0.00–0.07)
Basophils Absolute: 0 10*3/uL (ref 0.0–0.1)
Basophils Relative: 0 %
Eosinophils Absolute: 0 10*3/uL (ref 0.0–0.5)
Eosinophils Relative: 0 %
HCT: 39.9 % (ref 36.0–46.0)
Hemoglobin: 13.1 g/dL (ref 12.0–15.0)
Immature Granulocytes: 0 %
Lymphocytes Relative: 14 %
Lymphs Abs: 0.8 10*3/uL (ref 0.7–4.0)
MCH: 29.8 pg (ref 26.0–34.0)
MCHC: 32.8 g/dL (ref 30.0–36.0)
MCV: 90.9 fL (ref 80.0–100.0)
Monocytes Absolute: 0.1 10*3/uL (ref 0.1–1.0)
Monocytes Relative: 2 %
Neutro Abs: 5 10*3/uL (ref 1.7–7.7)
Neutrophils Relative %: 84 %
Platelets: 222 10*3/uL (ref 150–400)
RBC: 4.39 MIL/uL (ref 3.87–5.11)
RDW: 12.7 % (ref 11.5–15.5)
WBC: 6 10*3/uL (ref 4.0–10.5)
nRBC: 0 % (ref 0.0–0.2)

## 2019-02-06 LAB — PREGNANCY, URINE: Preg Test, Ur: NEGATIVE

## 2019-02-06 LAB — TROPONIN I: Troponin I: <0.03 ng/mL (ref <0.03)

## 2019-02-06 LAB — D-DIMER, QUANTITATIVE: D-Dimer, Quant: <0.27 ug/mL-FEU (ref 0.00–0.50)

## 2019-02-06 MED ORDER — ALBUTEROL SULFATE HFA 108 (90 BASE) MCG/ACT IN AERS
1.0000 | INHALATION_SPRAY | Freq: Once | RESPIRATORY_TRACT | Status: AC
  Administered 2019-02-06: 15:00:00 2 via RESPIRATORY_TRACT
  Filled 2019-02-06: qty 6.7

## 2019-02-06 MED ORDER — ALBUTEROL SULFATE HFA 108 (90 BASE) MCG/ACT IN AERS
INHALATION_SPRAY | RESPIRATORY_TRACT | Status: AC
  Administered 2019-02-06: 2 via RESPIRATORY_TRACT
  Filled 2019-02-06: qty 6.7

## 2019-02-06 MED ORDER — PROMETHAZINE-DM 6.25-15 MG/5ML PO SYRP: 5.0000 mL | ORAL_SOLUTION | Freq: Four times a day (QID) | ORAL | 0 refills | Status: DC | PRN

## 2019-02-06 NOTE — Discharge Instructions (Addendum)
1-2 puffs of the albuterol inhaler every 4-6 hrs as needed.  You may continue taking your antibiotic and prednisone as directed.  Follow-up with your primary provider for recheck.  Return to ER if you develop fever or shortness of breath.       Person Under Monitoring Name: [name]  Location: [id number] [place] [address]   Infection Prevention Recommendations for Individuals Confirmed to have, or Being Evaluated for, 2019 Novel Coronavirus (COVID-19) Infection Who Receive Care at Home  Individuals who are confirmed to have, or are being evaluated for, COVID-19 should follow the prevention steps below until a healthcare provider or local or state health department says they can return to normal activities.  Stay home except to get medical care You should restrict activities outside your home, except for getting medical care. Do not go to work, school, or public areas, and do not use public transportation or taxis.  Call ahead before visiting your doctor Before your medical appointment, call the healthcare provider and tell them that you have, or are being evaluated for, COVID-19 infection. This will help the healthcare providers office take steps to keep other people from getting infected. Ask your healthcare provider to call the local or state health department.  Monitor your symptoms Seek prompt medical attention if your illness is worsening (e.g., difficulty breathing). Before going to your medical appointment, call the healthcare provider and tell them that you have, or are being evaluated for, COVID-19 infection. Ask your healthcare provider to call the local or state health department.  Wear a facemask You should wear a facemask that covers your nose and mouth when you are in the same room with other people and when you visit a healthcare provider. People who live with or visit you should also wear a facemask while they are in the same room with you.  Separate  yourself from other people in your home As much as possible, you should stay in a different room from other people in your home. Also, you should use a separate bathroom, if available.  Avoid sharing household items You should not share dishes, drinking glasses, cups, eating utensils, towels, bedding, or other items with other people in your home. After using these items, you should wash them thoroughly with soap and water.  Cover your coughs and sneezes Cover your mouth and nose with a tissue when you cough or sneeze, or you can cough or sneeze into your sleeve. Throw used tissues in a lined trash can, and immediately wash your hands with soap and water for at least 20 seconds or use an alcohol-based hand rub.  Wash your [employer] your hands often and thoroughly with soap and water for at least 20 seconds. You can use an alcohol-based hand sanitizer if soap and water are not available and if your hands are not visibly dirty. Avoid touching your eyes, nose, and mouth with unwashed hands.   Prevention Steps for Caregivers and Household Members of Individuals Confirmed to have, or Being Evaluated for, COVID-19 Infection Being Cared for in the Home  If you live with, or provide care at home for, a person confirmed to have, or being evaluated for, COVID-19 infection please follow these guidelines to prevent infection:  Follow healthcare providers instructions Make sure that you understand and can help the patient follow any healthcare provider instructions for all care.  Provide for the patients basic needs You should help the patient with basic needs in the home and provide  support for getting groceries, prescriptions, and other personal needs.  Monitor the patients symptoms If they are getting sicker, call his or her medical provider and tell them that the patient has, or is being evaluated for, COVID-19 infection. This will help the healthcare providers office take steps to keep  other people from getting infected. Ask the healthcare provider to call the local or state health department.  Limit the number of people who have contact with the patient If possible, have only one caregiver for the patient. Other household members should stay in another home or place of residence. If this is not possible, they should stay in another room, or be separated from the patient as much as possible. Use a separate bathroom, if available. Restrict visitors who do not have an essential need to be in the home.  Keep older adults, very young children, and other sick people away from the patient Keep older adults, very young children, and those who have compromised immune systems or chronic health conditions away from the patient. This includes people with chronic heart, lung, or kidney conditions, diabetes, and cancer.  Ensure good ventilation Make sure that shared spaces in the home have good air flow, such as from an air conditioner or an opened window, weather permitting.  Wash your hands often Wash your hands often and thoroughly with soap and water for at least 20 seconds. You can use an alcohol based hand sanitizer if soap and water are not available and if your hands are not visibly dirty. Avoid touching your eyes, nose, and mouth with unwashed hands. Use disposable paper towels to dry your hands. If not available, use dedicated cloth towels and replace them when they become wet.  Wear a facemask and gloves Wear a disposable facemask at all times in the room and gloves when you touch or have contact with the patients blood, body fluids, and/or secretions or excretions, such as sweat, saliva, sputum, nasal mucus, vomit, urine, or feces.  Ensure the mask fits over your nose and mouth tightly, and do not touch it during use. Throw out disposable facemasks and gloves after using them. Do not reuse. Wash your hands immediately after removing your facemask and gloves. If your  personal clothing becomes contaminated, carefully remove clothing and launder. Wash your hands after handling contaminated clothing. Place all used disposable facemasks, gloves, and other waste in a lined container before disposing them with other household waste. Remove gloves and wash your hands immediately after handling these items.  Do not share dishes, glasses, or other household items with the patient Avoid sharing household items. You should not share dishes, drinking glasses, cups, eating utensils, towels, bedding, or other items with a patient who is confirmed to have, or being evaluated for, COVID-19 infection. After the person uses these items, you should wash them thoroughly with soap and water.  Wash laundry thoroughly Immediately remove and wash clothes or bedding that have blood, body fluids, and/or secretions or excretions, such as sweat, saliva, sputum, nasal mucus, vomit, urine, or feces, on them. Wear gloves when handling laundry from the patient. Read and follow directions on labels of laundry or clothing items and detergent. In general, wash and dry with the warmest temperatures recommended on the label.  Clean all areas the individual has used often Clean all touchable surfaces, such as counters, tabletops, doorknobs, bathroom fixtures, toilets, phones, keyboards, tablets, and bedside tables, every day. Also, clean any surfaces that may have blood, body fluids, and/or secretions or excretions  on them. Wear gloves when cleaning surfaces the patient has come in contact with. Use a diluted bleach solution (e.g., dilute bleach with 1 part bleach and 10 parts water) or a household disinfectant with a label that says EPA-registered for coronaviruses. To make a bleach solution at home, add 1 tablespoon of bleach to 1 quart (4 cups) of water. For a larger supply, add  cup of bleach to 1 gallon (16 cups) of water. Read labels of cleaning products and follow recommendations provided on  product labels. Labels contain instructions for safe and effective use of the cleaning product including precautions you should take when applying the product, such as wearing gloves or eye protection and making sure you have good ventilation during use of the product. Remove gloves and wash hands immediately after cleaning.  Monitor yourself for signs and symptoms of illness Caregivers and household members are considered close contacts, should monitor their health, and will be asked to limit movement outside of the home to the extent possible. Follow the monitoring steps for close contacts listed on the symptom monitoring form.   ? If you have additional questions, contact your local health department or call the epidemiologist on call at [phone] (available 24/7). ? This guidance is subject to change. For the most up-to-date guidance from [facility], please refer to their website: TripMetro.huhttps://www.cdc.gov/coronavirus/2019-ncov/hcp/guidance-prevent-spread.html

## 2019-02-06 NOTE — ED Triage Notes (Signed)
Patient complaining of shortness of breath "for a few days" and coughing that started this morning. States she was given prednisone by PCP and was told by Dr Phillips Odor to come to ER today for Covid testing. Patient also complaining of back and chest pain since Monday.

## 2019-02-06 NOTE — ED Notes (Signed)
Call from [facility] [place]  Call back number [phone]

## 2019-02-06 NOTE — ED Provider Notes (Signed)
[facility] EMERGENCY DEPARTMENT Provider Note   CSN: [id number] Arrival date & time: 02/06/19  1051    History   Chief Complaint Chief Complaint  Patient presents with  . Shortness of Breath    HPI [name] is a 33 y.o. female.     HPI   [name] is a 33 y.o. female with hx of lupus and thyroid disease, presents to the Emergency Department complaining of shortness of breath this week and cough that developed this morning.  She describes the cough as mild and "like a tickle"  Cough has been associated with some pain to her left chest and upper back.  She was seen by her PCP via tele-visit earlier this week and prescribed prednisone and Keflex for possible UTI.  She contacted the office this morning and was advised to come here for COVID testing.  She denies fever, chills, generalized body aches, vomiting or diarrhea, and nasal congestion.  She endorses frequent UTI's, but denies burning with urination, increased frequency or hesitancy.  She is employed as a Psychologist, occupational, but also denies known COVID exposures or recent travel.  No hx of DVT/PE.  Former smoker.    Past Medical History:  Diagnosis Date  . Lupus (HCC)   . Thyroid disease     Patient Active Problem List   Diagnosis Date Noted  . Hypothyroidism due to Hashimoto's thyroiditis 09/17/2016  . Hashimoto's thyroiditis 03/14/2016  . Lupus (HCC) 03/14/2016  . Smoker 03/14/2016    History reviewed. No pertinent surgical history.   OB History    Gravida  1   Para      Term      Preterm      AB      Living  1     SAB      TAB      Ectopic      Multiple      Live Births               Home Medications    Prior to Admission medications   Medication Sig Start Date End Date Taking? Authorizing Provider  drospirenone-ethinyl estradiol (YAZ,GIANVI,LORYNA) 3-0.02 MG tablet Take 1 tablet by mouth daily.   Yes [provider]  levothyroxine (SYNTHROID, LEVOTHROID) 88 MCG tablet Take  1 tablet (88 mcg total) by mouth daily before breakfast. 06/02/18  Yes Nida, Denman George, MD    Family History Family History  Problem Relation Age of Onset  . Hypertension Father     Social History Social History   Tobacco Use  . Smoking status: Former Games developer  . Smokeless tobacco: Never Used  Substance Use Topics  . Alcohol use: Yes    Comment: occasionally  . Drug use: No     Allergies   Patient has no known allergies.   Review of Systems Review of Systems  Constitutional: Negative for appetite change, chills and fever.  HENT: Positive for congestion. Negative for sore throat and trouble swallowing.   Respiratory: Positive for cough and shortness of breath. Negative for chest tightness and wheezing.   Cardiovascular: Positive for chest pain.  Gastrointestinal: Negative for abdominal pain, diarrhea, nausea and vomiting.  Genitourinary: Negative for difficulty urinating, dysuria and flank pain.  Musculoskeletal: Negative for arthralgias and myalgias.  Skin: Negative for rash.  Neurological: Negative for dizziness, weakness and numbness.  Hematological: Negative for adenopathy.     Physical Exam Updated Vital Signs BP 131/71 (BP Location: Right Arm)   Pulse 77  Temp 98.4 F (36.9 C) (Oral)   Resp 18   Ht  (1.6 m)   Wt 79.4 kg   LMP 10/07/2018 Comment: preg test ordered  SpO2 99%   BMI 31.00 kg/m   Physical Exam Vitals signs and nursing note reviewed.  Constitutional:      Appearance: Normal appearance. She is not ill-appearing.  Neck:     Musculoskeletal: Normal range of motion. No neck rigidity.  Cardiovascular:     Rate and Rhythm: Normal rate and regular rhythm.     Pulses: Normal pulses.  Pulmonary:     Effort: Pulmonary effort is normal. No respiratory distress.     Breath sounds: Normal breath sounds. No wheezing or rhonchi.  Chest:     Chest wall: No tenderness.  Abdominal:     General: There is no distension.     Palpations:  Abdomen is soft.     Tenderness: There is no abdominal tenderness.  Musculoskeletal:     Right lower leg: No edema.     Left lower leg: No edema.  Skin:    General: Skin is warm.     Capillary Refill: Capillary refill takes less than 2 seconds.     Findings: No rash.  Neurological:     General: No focal deficit present.     Mental Status: She is alert.     Sensory: No sensory deficit.      ED Treatments / Results  Labs (all labs ordered are listed, but only abnormal results are displayed) Labs Reviewed  URINALYSIS, ROUTINE W REFLEX MICROSCOPIC - Abnormal; Notable for the following components:      Result Value   Color, Urine STRAW (*)    Specific Gravity, Urine 1.004 (*)    All other components within normal limits  BASIC METABOLIC PANEL - Abnormal; Notable for the following components:   Glucose, Bld 126 (*)    All other components within normal limits  PREGNANCY, URINE  D-DIMER, QUANTITATIVE (NOT AT [facility])  CBC WITH DIFFERENTIAL/PLATELET  TROPONIN I    EKG EKG Interpretation  Date/Time:  Friday February 06 2019 12:22:20 EDT Ventricular Rate:  87 PR Interval:    QRS Duration: 82 QT Interval:  344 QTC Calculation: 414 R Axis:   41 Text Interpretation:  Sinus rhythm Low voltage, precordial leads Confirmed by Benjiman Core [phone]) on 02/06/2019 1:28:16 PM   Radiology Dg Chest Portable 1 View  Result Date: 02/06/2019 CLINICAL DATA:  Cough and shortness of breath EXAM: PORTABLE CHEST 1 VIEW COMPARISON:  None. FINDINGS: Lungs are clear. The heart size and pulmonary vascularity are normal. No adenopathy. No bone lesions. IMPRESSION: No edema or consolidation. Electronically Signed   By: Bretta Bang III M.D.   On: 02/06/2019 12:38    Procedures Procedures (including critical care time)  Medications Ordered in ED Medications  albuterol (VENTOLIN HFA) 108 (90 Base) MCG/ACT inhaler 1-2 puff (has no administration in time range)     Initial Impression /  Assessment and Plan / ED Course  I have reviewed the triage vital signs and the nursing notes.  Pertinent labs & imaging results that were available during my care of the patient were reviewed by me and considered in my medical decision making (see chart for details).        Pt well appearing.  Vitals and work up today are reasurring.  Albuterol MDI dispensed for home use.  She is currently taking abx for UTI along with prednisone prescribed by PCP.  No fever here, doubt PE  Holly RushJessica E Chanthavong was evaluated in Emergency Department on 02/06/2019 for the symptoms described in the history of present illness. She was evaluated in the context of the global COVID-19 pandemic, which necessitated consideration that the patient might be at risk for infection with the SARS-CoV-2 virus that causes COVID-19. Institutional protocols and algorithms that pertain to the evaluation of patients at risk for COVID-19 are in a state of rapid change based on information released by regulatory bodies including the CDC and federal and state organizations. These policies and algorithms were followed during the patient's care in the ED.  Discussed possibility of COVID infection although suspicion is low, she appears appropriate for d/c home and she agrees to self quarantine and she will be given home isolation instructions.  Return precautions discussed.    Final Clinical Impressions(s) / ED Diagnoses   Final diagnoses:  Cough    ED Discharge Orders    None       Pauline Ausriplett, Gisselle Galvis, PA-C 02/06/19 1610    Benjiman CorePickering, Nathan, MD 02/07/19 [phone]

## 2019-09-03 DIAGNOSIS — R5383 Other fatigue: Secondary | ICD-10-CM | POA: Diagnosis not present

## 2019-09-03 DIAGNOSIS — Z6832 Body mass index (BMI) 32.0-32.9, adult: Secondary | ICD-10-CM | POA: Diagnosis not present

## 2019-09-03 DIAGNOSIS — Z1389 Encounter for screening for other disorder: Secondary | ICD-10-CM | POA: Diagnosis not present

## 2019-09-03 DIAGNOSIS — E063 Autoimmune thyroiditis: Secondary | ICD-10-CM | POA: Diagnosis not present

## 2019-09-03 DIAGNOSIS — M940 Chondrocostal junction syndrome [Tietze]: Secondary | ICD-10-CM | POA: Diagnosis not present

## 2019-09-03 DIAGNOSIS — R0789 Other chest pain: Secondary | ICD-10-CM | POA: Diagnosis not present

## 2019-09-25 DIAGNOSIS — R0789 Other chest pain: Secondary | ICD-10-CM | POA: Diagnosis not present

## 2019-10-03 ENCOUNTER — Ambulatory Visit
Admission: EM | Admit: 2019-10-03 | Discharge: 2019-10-03 | Disposition: A | Discharge status: Home / Self Care | Payer: BLUE CROSS/BLUE SHIELD | Attending: Emergency Medicine | Admitting: Emergency Medicine

## 2019-10-03 ENCOUNTER — Other Ambulatory Visit: Payer: Self-pay

## 2019-10-03 DIAGNOSIS — Z20828 Contact with and (suspected) exposure to other viral communicable diseases: Secondary | ICD-10-CM | POA: Diagnosis not present

## 2019-10-03 DIAGNOSIS — Z20822 Contact with and (suspected) exposure to covid-19: Secondary | ICD-10-CM

## 2019-10-03 LAB — POC SARS CORONAVIRUS 2 AG -  ED: SARS Coronavirus 2 Ag: NEGATIVE

## 2019-10-03 MED ORDER — FLUTICASONE PROPIONATE 50 MCG/ACT NA SUSP: 2.0000 | Freq: Every day | NASAL | 0 refills | Status: DC

## 2019-10-03 MED ORDER — CETIRIZINE HCL 10 MG PO CHEW: 10.0000 mg | CHEWABLE_TABLET | Freq: Every day | ORAL | 0 refills | Status: DC

## 2019-10-03 NOTE — Discharge Instructions (Addendum)
Rapid COVID negative.  Culture sent.  Patient should remain in quarantine until they have received culture results.  If negative you may resume normal activities (go back to work/school) while practicing hand hygiene, social distance, and mask wearing.  If positive, patient should remain in quarantine for 10 days from symptom onset AND greater than 72 hours after symptoms resolution (absence of fever without the use of fever-reducing medication and improvement in respiratory symptoms), whichever is longer Get plenty of rest and push fluids Use OTC zyrtec for nasal congestion, runny nose, and/or sore throat Use OTC flonase for nasal congestion and runny nose Use medications daily for symptom relief Use OTC medications like ibuprofen or tylenol as needed fever or pain Call or go to the ED if you have any new or worsening symptoms such as fever, worsening cough, shortness of breath, chest tightness, chest pain, turning blue, changes in mental status, etc...   

## 2019-10-03 NOTE — ED Provider Notes (Signed)
[place]   [id number] 10/03/19 Arrival Time: 26   CC: COVID symptoms  SUBJECTIVE: History from: patient.  [name] is a 33 y.o. female who presents with low grade fever of 99, headache, body aches, runny nose, mild dry cough, PND, and sore throat x 3 days.  COVID positive exposure 1 week ago.  Denies recent travel.  Has tried tylenol with relief.  Denies aggravating factors.  Denies previous symptoms in the past.   Denies fever, chills, fatigue, sinus pain, rhinorrhea, sore throat, SOB, wheezing, chest pain, nausea, changes in bowel or bladder habits.    ROS: As per HPI.  All other pertinent ROS negative.     Past Medical History:  Diagnosis Date  . Lupus ([place])   . Thyroid disease    History reviewed. No pertinent surgical history. No Known Allergies No current facility-administered medications on file prior to encounter.   Current Outpatient Medications on File Prior to Encounter  Medication Sig Dispense Refill  . drospirenone-ethinyl estradiol (YAZ,GIANVI,LORYNA) 3-0.02 MG tablet Take 1 tablet by mouth daily.    . promethazine-dextromethorphan (PROMETHAZINE-DM) 6.25-15 MG/5ML syrup Take 5 mLs by mouth 4 (four) times daily as needed. 118 mL 0  . [DISCONTINUED] levothyroxine (SYNTHROID, LEVOTHROID) 88 MCG tablet Take 1 tablet (88 mcg total) by mouth daily before breakfast. 90 tablet 2   Social History   Socioeconomic History  . Marital status: Single    Spouse name: Not on file  . Number of children: Not on file  . Years of education: Not on file  . Highest education level: Not on file  Occupational History  . Not on file  Tobacco Use  . Smoking status: Former Research scientist (life sciences)  . Smokeless tobacco: Never Used  Substance and Sexual Activity  . Alcohol use: Yes    Comment: occasionally  . Drug use: No  . Sexual activity: Yes    Partners: Male    Birth control/protection: I.U.D.    Comment: mirena  Other Topics Concern  . Not on file  Social History  Narrative  . Not on file   Social Determinants of Health   Financial Resource Strain:   . Difficulty of Paying Living Expenses: Not on file  Food Insecurity:   . Worried About Charity fundraiser in the Last Year: Not on file  . Ran Out of Food in the Last Year: Not on file  Transportation Needs:   . Lack of Transportation (Medical): Not on file  . Lack of Transportation (Non-Medical): Not on file  Physical Activity:   . Days of Exercise per Week: Not on file  . Minutes of Exercise per Session: Not on file  Stress:   . Feeling of Stress : Not on file  Social Connections:   . Frequency of Communication with Friends and Family: Not on file  . Frequency of Social Gatherings with Friends and Family: Not on file  . Attends Religious Services: Not on file  . Active Member of Clubs or Organizations: Not on file  . Attends Archivist Meetings: Not on file  . Marital Status: Not on file  Intimate Partner Violence:   . Fear of Current or Ex-Partner: Not on file  . Emotionally Abused: Not on file  . Physically Abused: Not on file  . Sexually Abused: Not on file   Family History  Problem Relation Age of Onset  . Hypertension Father   . Healthy Mother     OBJECTIVE:  Vitals:   10/03/19  1342  BP: (!) 150/84  Pulse: (!) 114  Resp: 16  Temp: 98.6 F (37 C)  TempSrc: Oral  SpO2: 99%     General appearance: alert; appears fatigued, but nontoxic; speaking in full sentences and tolerating own secretions HEENT: NCAT; Ears: EACs clear, TMs pearly gray; Eyes: PERRL.  EOM grossly intact. Nose: nares patent without rhinorrhea, Throat: oropharynx clear, tonsils non erythematous or enlarged, uvula midline  Neck: supple without LAD Lungs: unlabored respirations, symmetrical air entry; cough: absent; no respiratory distress; CTAB Heart: regular rate and rhythm.   Skin: warm and dry Psychological: alert and cooperative; normal mood and affect  LABS:  Results for orders placed  or performed during the hospital encounter of 10/03/19 (from the past 24 hour(s))  POC SARS Coronavirus 2 Ag-ED - Nasal Swab (BD Veritor Kit)     Status: None   Collection Time: 10/03/19  2:00 PM  Result Value Ref Range   SARS Coronavirus 2 Ag Negative Negative     ASSESSMENT & PLAN:  1. Suspected COVID-19 virus infection   2. Exposure to COVID-19 virus     Meds ordered this encounter  Medications  . fluticasone (FLONASE) 50 MCG/ACT nasal spray    Sig: Place 2 sprays into both nostrils daily.    Dispense:  16 g    Refill:  0    Order Specific Question:   Supervising Provider    Answer:   Raylene Everts [[id number]]  . cetirizine (ZYRTEC) 10 MG chewable tablet    Sig: Chew 1 tablet (10 mg total) by mouth daily.    Dispense:  20 tablet    Refill:  0    Order Specific Question:   Supervising Provider    Answer:   Raylene Everts [[id number]]   Rapid COVID negative.  Culture sent.  Patient should remain in quarantine until they have received culture results.  If negative you may resume normal activities (go back to work/school) while practicing hand hygiene, social distance, and mask wearing.  If positive, patient should remain in quarantine for 10 days from symptom onset AND greater than 72 hours after symptoms resolution (absence of fever without the use of fever-reducing medication and improvement in respiratory symptoms), whichever is longer Get plenty of rest and push fluids Use OTC zyrtec for nasal congestion, runny nose, and/or sore throat Use OTC flonase for nasal congestion and runny nose Use medications daily for symptom relief Use OTC medications like ibuprofen or tylenol as needed fever or pain Call or go to the ED if you have any new or worsening symptoms such as fever, worsening cough, shortness of breath, chest tightness, chest pain, turning blue, changes in mental status, etc...    Reviewed expectations re: course of current medical issues. Questions  answered. Outlined signs and symptoms indicating need for more acute intervention. Patient verbalized understanding. After Visit Summary given.         Lestine Box, PA-C 10/03/19 [phone]

## 2019-10-03 NOTE — ED Triage Notes (Signed)
Pt presents to UC w/ c/o lowgrade fever, sore throat, headache x3 days. Pt has had positive covid exposure 1 week ago.

## 2019-10-04 LAB — NOVEL CORONAVIRUS, NAA: SARS-CoV-2, NAA: NOT DETECTED

## 2019-10-14 ENCOUNTER — Other Ambulatory Visit: Payer: Self-pay

## 2019-10-14 ENCOUNTER — Ambulatory Visit
Admission: EM | Admit: 2019-10-14 | Discharge: 2019-10-14 | Disposition: A | Discharge status: Home / Self Care | Payer: BC Managed Care – PPO | Attending: Urgent Care | Admitting: Urgent Care

## 2019-10-14 ENCOUNTER — Encounter: Payer: Self-pay | Admitting: Urgent Care

## 2019-10-14 DIAGNOSIS — R509 Fever, unspecified: Secondary | ICD-10-CM

## 2019-10-14 DIAGNOSIS — R519 Headache, unspecified: Secondary | ICD-10-CM | POA: Diagnosis not present

## 2019-10-14 DIAGNOSIS — R0602 Shortness of breath: Secondary | ICD-10-CM

## 2019-10-14 DIAGNOSIS — M329 Systemic lupus erythematosus, unspecified: Secondary | ICD-10-CM

## 2019-10-14 DIAGNOSIS — Z20828 Contact with and (suspected) exposure to other viral communicable diseases: Secondary | ICD-10-CM

## 2019-10-14 DIAGNOSIS — Z20822 Contact with and (suspected) exposure to covid-19: Secondary | ICD-10-CM

## 2019-10-14 NOTE — Discharge Instructions (Addendum)

## 2019-10-14 NOTE — ED Triage Notes (Signed)
Pt presents to UC stating she's had a positive covid exposure. Pt states she had a fever of 100.1 this morning. Pt took tylenol this morning after fever. Pt c/o headaches x4 days, and some sob last night.

## 2019-10-14 NOTE — ED Provider Notes (Signed)
[facility]    MRN: [id number] DOB: August 30, 1986  Subjective:   [name] is a 33 y.o. female presenting for 1 day history of acute onset persistent generalized headaches that are mild to moderate in nature.  Patient has also had subjective fever and shortness of breath overnight.  Her husband is positive for COVID-19.  Patient needs testing.  Risk factors include having lupus and thyroid disease.  No current facility-administered medications for this encounter.  Current Outpatient Medications:  .  cetirizine (ZYRTEC) 10 MG chewable tablet, Chew 1 tablet (10 mg total) by mouth daily., Disp: 20 tablet, Rfl: 0 .  drospirenone-ethinyl estradiol (YAZ,GIANVI,LORYNA) 3-0.02 MG tablet, Take 1 tablet by mouth daily., Disp: , Rfl:  .  fluticasone (FLONASE) 50 MCG/ACT nasal spray, Place 2 sprays into both nostrils daily., Disp: 16 g, Rfl: 0 .  promethazine-dextromethorphan (PROMETHAZINE-DM) 6.25-15 MG/5ML syrup, Take 5 mLs by mouth 4 (four) times daily as needed., Disp: 118 mL, Rfl: 0   No Known Allergies  Past Medical History:  Diagnosis Date  . Lupus ([facility])   . Thyroid disease      No past surgical history on file.  Family History  Problem Relation Age of Onset  . Hypertension Father   . Healthy Mother     Social History   Tobacco Use  . Smoking status: Former Games developer  . Smokeless tobacco: Never Used  Substance Use Topics  . Alcohol use: Yes    Comment: occasionally  . Drug use: No    Review of Systems  Constitutional: Positive for fever. Negative for malaise/fatigue.  HENT: Negative for congestion, ear pain, sinus pain and sore throat.   Eyes: Negative for discharge and redness.  Respiratory: Positive for shortness of breath. Negative for cough, hemoptysis and wheezing.   Cardiovascular: Negative for chest pain.  Gastrointestinal: Negative for abdominal pain, diarrhea, nausea and vomiting.  Genitourinary: Negative for dysuria, flank pain and hematuria.   Musculoskeletal: Negative for myalgias.  Skin: Negative for rash.  Neurological: Positive for headaches. Negative for dizziness and weakness.  Psychiatric/Behavioral: Negative for depression and substance abuse.     Objective:   Vitals: BP 135/85 (BP Location: Right Arm)   Pulse 85   Temp 98.7 F (37.1 C) (Oral)   Resp 18   SpO2 98%   Physical Exam Constitutional:      General: She is not in acute distress.    Appearance: Normal appearance. She is well-developed. She is not ill-appearing, toxic-appearing or diaphoretic.  HENT:     Head: Normocephalic and atraumatic.     Right Ear: Tympanic membrane and ear canal normal. No drainage or tenderness. No middle ear effusion. Tympanic membrane is not erythematous.     Left Ear: Tympanic membrane and ear canal normal. No drainage or tenderness.  No middle ear effusion. Tympanic membrane is not erythematous.     Nose: Nose normal. No congestion or rhinorrhea.     Mouth/Throat:     Mouth: Mucous membranes are moist. No oral lesions.     Pharynx: No pharyngeal swelling, oropharyngeal exudate, posterior oropharyngeal erythema or uvula swelling.     Tonsils: No tonsillar exudate or tonsillar abscesses.  Eyes:     Extraocular Movements: Extraocular movements intact.     Right eye: Normal extraocular motion.     Left eye: Normal extraocular motion.     Conjunctiva/sclera: Conjunctivae normal.     Pupils: Pupils are equal, round, and reactive to light.  Cardiovascular:     Rate  and Rhythm: Normal rate and regular rhythm.     Pulses: Normal pulses.     Heart sounds: Normal heart sounds. No murmur. No friction rub. No gallop.   Pulmonary:     Effort: Pulmonary effort is normal. No respiratory distress.     Breath sounds: Normal breath sounds. No stridor. No wheezing, rhonchi or rales.  Musculoskeletal:     Cervical back: Normal range of motion and neck supple.  Lymphadenopathy:     Cervical: No cervical adenopathy.  Skin:    General:  Skin is warm and dry.     Findings: No rash.  Neurological:     General: No focal deficit present.     Mental Status: She is alert and oriented to person, place, and time.     Cranial Nerves: No cranial nerve deficit.     Motor: No weakness.     Coordination: Coordination normal.     Gait: Gait normal.     Deep Tendon Reflexes: Reflexes normal.  Psychiatric:        Mood and Affect: Mood normal.        Behavior: Behavior normal.        Thought Content: Thought content normal.        Judgment: Judgment normal.      Assessment and Plan :   1. Exposure to COVID-19 virus   2. Generalized headaches   3. Shortness of breath   4. Systemic lupus erythematosus, unspecified SLE type, unspecified organ involvement status ([place])     Will manage clinical diagnosis of COVID-19 given close exposure to her husband with supportive care. Counseled patient on nature of COVID-19 including modes of transmission, diagnostic testing, management and supportive care.  Offered symptomatic relief including albuterol inhaler, patient states that she already has one at home. Counseled patient on potential for adverse effects with medications prescribed/recommended today, ER and return-to-clinic precautions discussed, patient verbalized understanding.     Jaynee Eagles, PA-C 10/14/19 1022

## 2019-10-15 LAB — NOVEL CORONAVIRUS, NAA: SARS-CoV-2, NAA: DETECTED — AB

## 2019-10-17 ENCOUNTER — Telehealth: Payer: Self-pay | Admitting: Unknown Physician Specialty

## 2019-10-17 NOTE — Telephone Encounter (Signed)
  I connected by phone with [name] on 10/17/2019 at 1:31 PM to discuss the potential use of an new treatment for mild to moderate COVID-19 viral infection in non-hospitalized patients.  This patient is a 33 y.o. female that meets the FDA criteria for Emergency Use Authorization of bamlanivimab or casirivimab\imdevimab.  Has a (+) direct SARS-CoV-2 viral test result  Has mild or moderate COVID-19   Is ? 33 years of age and weighs ? 40 kg  Is NOT hospitalized due to COVID-19  Is NOT requiring oxygen therapy or requiring an increase in baseline oxygen flow rate due to COVID-19  Is within 10 days of symptom onset  Has at least one of the high risk factor(s) for progression to severe COVID-19 and/or hospitalization as defined in EUA.  Specific high risk criteria : BMI >/= 35   I have spoken and communicated the following to the patient or parent/caregiver:  1. FDA has authorized the emergency use of bamlanivimab and casirivimab\imdevimab for the treatment of mild to moderate COVID-19 in adults and pediatric patients with positive results of direct SARS-CoV-2 viral testing who are 67 years of age and older weighing at least 40 kg, and who are at high risk for progressing to severe COVID-19 and/or hospitalization.  2. The significant known and potential risks and benefits of bamlanivimab and casirivimab\imdevimab, and the extent to which such potential risks and benefits are unknown.  3. Information on available alternative treatments and the risks and benefits of those alternatives, including clinical trials.  4. Patients treated with bamlanivimab and casirivimab\imdevimab should continue to self-isolate and use infection control measures (e.g., wear mask, isolate, social distance, avoid sharing personal items, clean and disinfect "high touch" surfaces, and frequent handwashing) according to CDC guidelines.   5. The patient or parent/caregiver has the option to accept or refuse  bamlanivimab or casirivimab\imdevimab .  After reviewing this information with the patient, The patient has DECLINED offer to receive the infusion.[name] 10/17/2019 1:31 PM

## 2019-10-19 DIAGNOSIS — M329 Systemic lupus erythematosus, unspecified: Secondary | ICD-10-CM | POA: Diagnosis not present

## 2019-10-19 DIAGNOSIS — U071 COVID-19: Secondary | ICD-10-CM | POA: Diagnosis not present

## 2019-10-26 ENCOUNTER — Other Ambulatory Visit: Payer: BC Managed Care – PPO

## 2019-10-27 ENCOUNTER — Ambulatory Visit: Payer: BC Managed Care – PPO | Attending: Internal Medicine

## 2019-10-27 ENCOUNTER — Other Ambulatory Visit: Payer: Self-pay

## 2019-10-27 DIAGNOSIS — Z20822 Contact with and (suspected) exposure to covid-19: Secondary | ICD-10-CM | POA: Diagnosis not present

## 2019-10-29 ENCOUNTER — Telehealth: Payer: Self-pay

## 2019-10-29 LAB — NOVEL CORONAVIRUS, NAA: SARS-CoV-2, NAA: DETECTED — AB

## 2019-10-29 NOTE — Telephone Encounter (Signed)
Pt asking about not being able to go back to work if still testing positive. Explained why she could still be showing positive results for up to 3 months. Pt verbalized understanding, all questions answered.

## 2019-11-24 DIAGNOSIS — Z01419 Encounter for gynecological examination (general) (routine) without abnormal findings: Secondary | ICD-10-CM | POA: Diagnosis not present

## 2019-11-24 DIAGNOSIS — R87610 Atypical squamous cells of undetermined significance on cytologic smear of cervix (ASC-US): Secondary | ICD-10-CM | POA: Diagnosis not present

## 2019-11-24 DIAGNOSIS — Z3041 Encounter for surveillance of contraceptive pills: Secondary | ICD-10-CM | POA: Diagnosis not present

## 2019-11-24 DIAGNOSIS — R87612 Low grade squamous intraepithelial lesion on cytologic smear of cervix (LGSIL): Secondary | ICD-10-CM | POA: Diagnosis not present

## 2019-11-24 DIAGNOSIS — R102 Pelvic and perineal pain: Secondary | ICD-10-CM | POA: Diagnosis not present

## 2019-11-24 DIAGNOSIS — Z6835 Body mass index (BMI) 35.0-35.9, adult: Secondary | ICD-10-CM | POA: Diagnosis not present

## 2019-12-02 ENCOUNTER — Other Ambulatory Visit: Payer: Self-pay | Admitting: Obstetrics and Gynecology

## 2019-12-02 DIAGNOSIS — N871 Moderate cervical dysplasia: Secondary | ICD-10-CM | POA: Diagnosis not present

## 2019-12-02 DIAGNOSIS — R87612 Low grade squamous intraepithelial lesion on cytologic smear of cervix (LGSIL): Secondary | ICD-10-CM | POA: Diagnosis not present

## 2019-12-02 DIAGNOSIS — N870 Mild cervical dysplasia: Secondary | ICD-10-CM | POA: Diagnosis not present

## 2019-12-02 DIAGNOSIS — R102 Pelvic and perineal pain: Secondary | ICD-10-CM

## 2019-12-14 ENCOUNTER — Other Ambulatory Visit: Payer: BC Managed Care – PPO

## 2019-12-21 DIAGNOSIS — N870 Mild cervical dysplasia: Secondary | ICD-10-CM | POA: Diagnosis not present

## 2019-12-21 DIAGNOSIS — N871 Moderate cervical dysplasia: Secondary | ICD-10-CM | POA: Diagnosis not present

## 2020-01-29 DIAGNOSIS — N871 Moderate cervical dysplasia: Secondary | ICD-10-CM | POA: Diagnosis not present

## 2020-04-05 IMAGING — CR PORTABLE CHEST - 1 VIEW
1 series · 1 of 1 positions shown · non-contrast
Comparison: None.

CLINICAL DATA: Cough and shortness of breath

EXAM:
PORTABLE CHEST 1 VIEW

[portable]
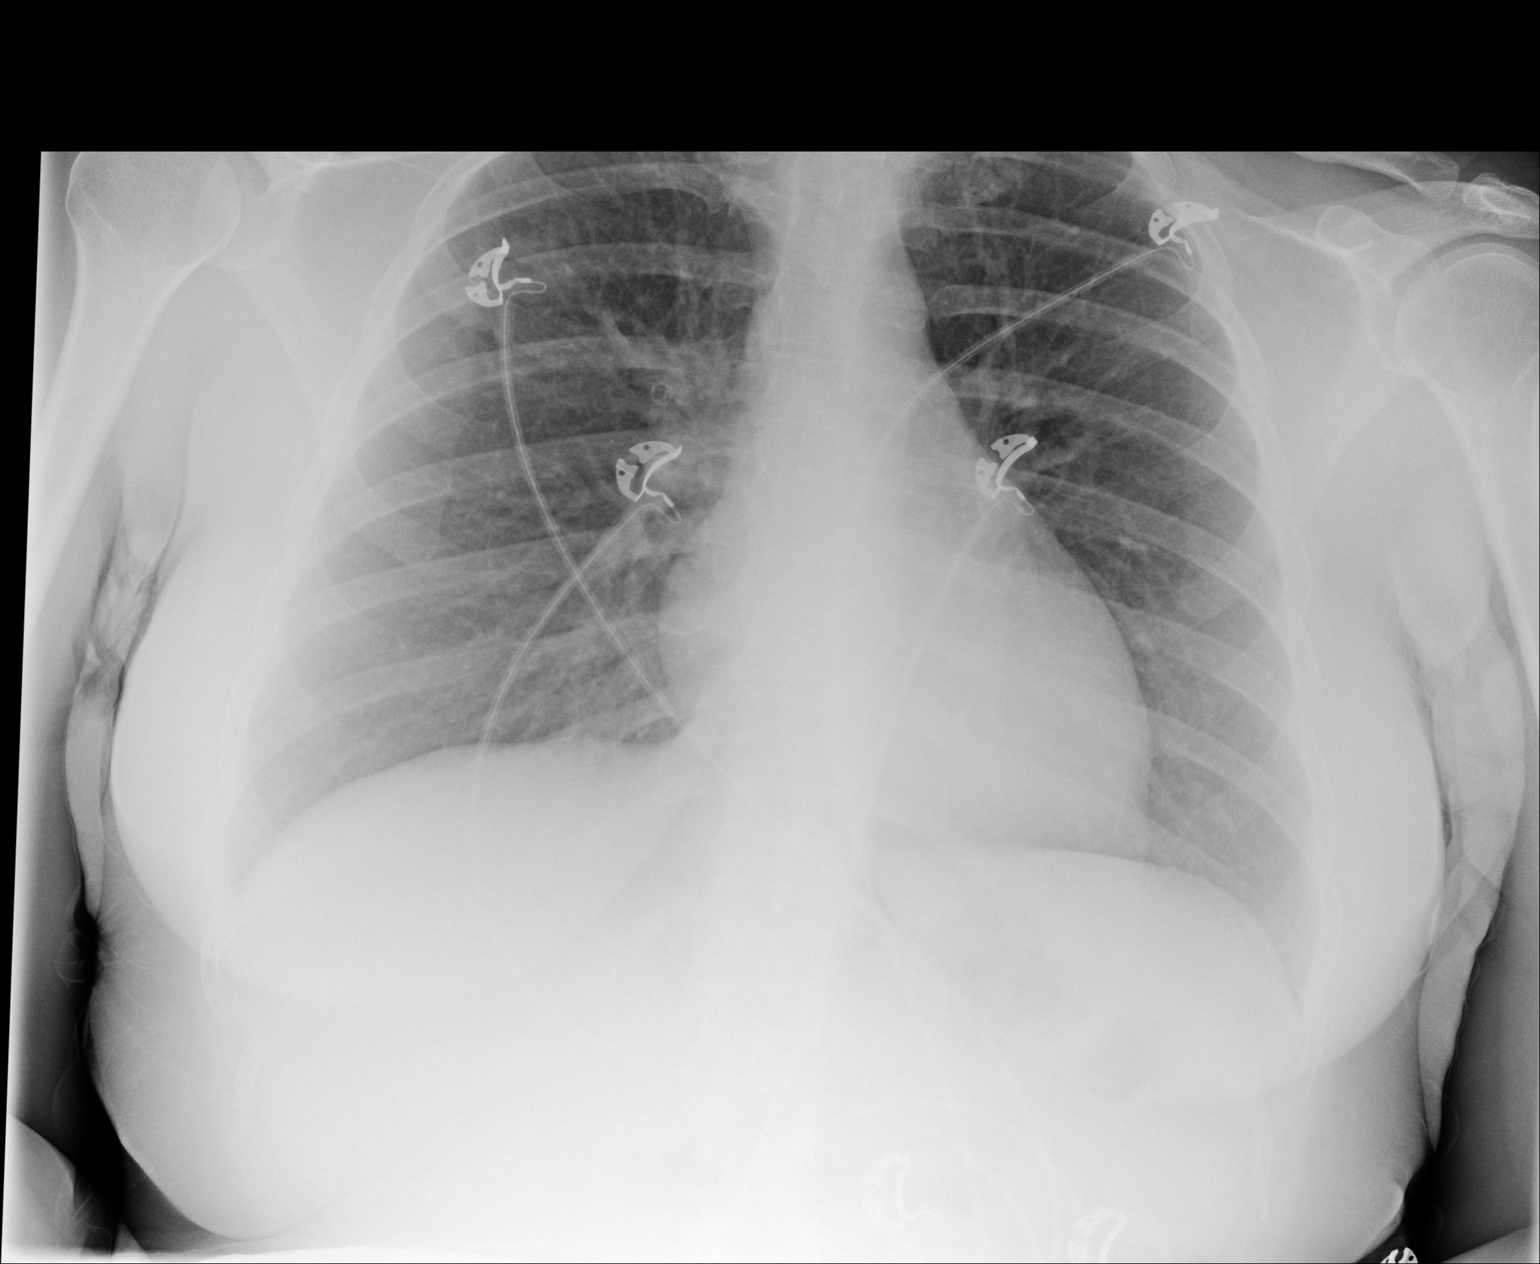

[1 of 1 positions shown; findings below may reference images not displayed]

FINDINGS: Lungs are clear. The heart size and pulmonary vascularity are
normal. No adenopathy. No bone lesions.
IMPRESSION: No edema or consolidation.

## 2020-07-06 ENCOUNTER — Other Ambulatory Visit: Payer: BC Managed Care – PPO

## 2021-05-15 LAB — OB RESULTS CONSOLE ANTIBODY SCREEN: Antibody Screen: NEGATIVE

## 2021-05-15 LAB — OB RESULTS CONSOLE GC/CHLAMYDIA
Chlamydia: NEGATIVE
Gonorrhea: NEGATIVE

## 2021-05-15 LAB — OB RESULTS CONSOLE HEPATITIS B SURFACE ANTIGEN: Hepatitis B Surface Ag: NEGATIVE

## 2021-05-15 LAB — OB RESULTS CONSOLE HIV ANTIBODY (ROUTINE TESTING): HIV: NONREACTIVE

## 2021-05-15 LAB — OB RESULTS CONSOLE ABO/RH: RH Type: NEGATIVE

## 2021-05-15 LAB — HEPATITIS C ANTIBODY: HCV Ab: NEGATIVE

## 2021-05-15 LAB — OB RESULTS CONSOLE RUBELLA ANTIBODY, IGM: Rubella: IMMUNE

## 2021-05-15 LAB — OB RESULTS CONSOLE RPR: RPR: NONREACTIVE

## 2021-06-28 ENCOUNTER — Other Ambulatory Visit: Payer: Self-pay

## 2021-07-04 ENCOUNTER — Other Ambulatory Visit: Payer: Self-pay | Admitting: Obstetrics and Gynecology

## 2021-07-04 DIAGNOSIS — Z363 Encounter for antenatal screening for malformations: Secondary | ICD-10-CM

## 2021-07-07 ENCOUNTER — Encounter: Payer: Self-pay | Admitting: *Deleted

## 2021-07-11 ENCOUNTER — Ambulatory Visit: Payer: BC Managed Care – PPO

## 2021-07-12 ENCOUNTER — Ambulatory Visit: Payer: BC Managed Care – PPO | Attending: Obstetrics and Gynecology

## 2021-07-12 ENCOUNTER — Other Ambulatory Visit: Payer: Self-pay

## 2021-07-12 DIAGNOSIS — O281 Abnormal biochemical finding on antenatal screening of mother: Secondary | ICD-10-CM

## 2021-07-12 NOTE — Progress Notes (Signed)
Name: [name] Indication: Atypical Result on Non-Invasive Prenatal Screening  DOB: [birth year] Age: 35 y.o.   EDC: 12/20/2021 LMP: 03/15/2021 Referring Provider: Nigel Bridgeman, CNM  EGA: [redacted]w[redacted]d Genetic Counselor: Teena Dunk, MS, CGC Katrina Stack, MS, CGC  OB Hx: [id number] Date of Appointment: 07/12/2021  Accompanied by: Her mother, Alice Face to Face Time: 40 Minutes   Previous Testing Completed: CBC from 05/15/2021 reviewed. MCV within normal limits. It is unlikely that [name] is a beta thalassemia carrier or an alpha thalassemia carrier of the double-gene deletion. Individuals with a normal MCV may be single-gene deletion carriers, but it is unlikely that the current pregnancy would be affected with alpha or beta thalassemia major. Shamonique completed a Non-Invasive Prenatal Screen (NIPS) in this twin pregnancy (report scanned into Epic under the Media tab). The result is atypical. See genetic counseling portion of note below.   Medical History:  This is [name]'s 3rd pregnancy. She has a living, healthy daughter. She has had one early spontaneous loss. Reports she takes PNV, levothyroxine, and Vitamin D. Patient reports she has lupus and hypothyroidism and is also Rh negative. Reports exposure to alcohol and tobacco before she learned she was pregnant between 5 and 6 weeks.  Denies bleeding, infections, and fevers in this pregnancy.   Family History: A pedigree was created and scanned into Epic under the Media tab. Lular's maternal grandmother is reported to have had three spontaneous miscarriages. Medical records regarding these miscarriages are not available for genetic counseling to review. Zoila's maternal uncle has a history of renal cell carcinoma. Maternal ethnicity reported as Caucasian and paternal ethnicity reported as Caucasian. Denies Jewish ancestry. Family history not remarkable for consanguinity, individuals with birth defects, intellectual disability, autism  spectrum disorder, mental illness, still births, or unexplained neonatal death.     Genetic Counseling:  Atypical Result on Non-Invasive Prenatal Screening. [name] completed a Non-Invasive Prenatal Screen (NIPS) in this twin pregnancy. The NIPS result is atypical. The performing laboratory reports there is a suspected finding outside the scope of the test involving the Y chromosome, which could include, but is not limited to fetal mosaicism, fetal chromosome abnormality, or normal variation. The atypical finding is not suspected to be of maternal origin and repeat NIPS is not recommended in this pregnancy. Genetic counseling reviewed this result with [name] and her mother. We discussed the technology of NIPS and the benefits/limitations of this screening test. We briefly reviewed some of the clinical manifestations of sex chromosome conditions and offered [name] the option of amniocentesis for prenatal diagnosis and/or testing of both babies for chromosome conditions after birth.  Birth Defects. All babies have approximately a 3-5% risk for a birth defect and a majority of these defects cannot be detected through the screening or diagnostic testing listed below. Ultrasound may detect some birth defects, but it may not detect all birth defects. About half of pregnancies with Down syndrome do not show any soft markers on ultrasound. A normal ultrasound does not guarantee a healthy pregnancy.   Testing/Screening Options:   Amniocentesis. This procedure is available for prenatal diagnosis. Possible procedural difficulties and complications that can arise include maternal infection, cramping, bleeding, fluid leakage, and/or pregnancy loss. The risk for pregnancy loss with an amniocentesis is 1/500. Per the [employer] of [employer]stetricians and Gynecologists (ACOG) Practice Bulletin 162, all pregnant women should be offered prenatal assessment for aneuploidy by diagnostic testing regardless of maternal age or  other risk factors.  Carrier screening. Per the ACOG Committee Opinion 691,  all women who are considering a pregnancy or are currently pregnant should be offered carrier screening for, at minimum, the autosomal recessive conditions Cystic Fibrosis (CF), Spinal Muscular Atrophy (SMA), and hemoglobinopathies. The mode of inheritance, clinical manifestations of these conditions, as well as details about testing were reviewed. A negative result on carrier screening reduces the likelihood of being a carrier, however, does not entirely rule out the possibility. If [name] was found to be a carrier for a specific condition, carrier screening for their reproductive partner would be recommended.    Patient Plan:  Proceed with: Routine prenatal care Informed consent was obtained, and all questions were answered.  Declined: Amniocentesis, Carrier Screening   Thank you for sharing in the care of [name] with [place].  Please do not hesitate to contact us if you have any questions.  Janace Aris, MS, CGC Certified [employer]

## 2021-07-19 ENCOUNTER — Ambulatory Visit: Payer: Self-pay

## 2021-07-24 ENCOUNTER — Ambulatory Visit: Payer: BC Managed Care – PPO

## 2021-07-25 ENCOUNTER — Other Ambulatory Visit: Payer: Self-pay

## 2021-07-25 ENCOUNTER — Encounter: Payer: Self-pay | Admitting: *Deleted

## 2021-07-25 ENCOUNTER — Ambulatory Visit: Payer: BC Managed Care – PPO | Admitting: *Deleted

## 2021-07-25 ENCOUNTER — Ambulatory Visit: Payer: BC Managed Care – PPO | Attending: Obstetrics and Gynecology

## 2021-07-25 ENCOUNTER — Ambulatory Visit (HOSPITAL_BASED_OUTPATIENT_CLINIC_OR_DEPARTMENT_OTHER): Payer: BC Managed Care – PPO | Admitting: Obstetrics

## 2021-07-25 ENCOUNTER — Other Ambulatory Visit: Payer: Self-pay | Admitting: Obstetrics and Gynecology

## 2021-07-25 VITALS — BP 128/60 | HR 89

## 2021-07-25 DIAGNOSIS — O30042 Twin pregnancy, dichorionic/diamniotic, second trimester: Secondary | ICD-10-CM | POA: Diagnosis present

## 2021-07-25 DIAGNOSIS — Z3A19 19 weeks gestation of pregnancy: Secondary | ICD-10-CM

## 2021-07-25 DIAGNOSIS — O280 Abnormal hematological finding on antenatal screening of mother: Secondary | ICD-10-CM | POA: Diagnosis present

## 2021-07-25 DIAGNOSIS — O99212 Obesity complicating pregnancy, second trimester: Secondary | ICD-10-CM | POA: Insufficient documentation

## 2021-07-25 DIAGNOSIS — Z363 Encounter for antenatal screening for malformations: Secondary | ICD-10-CM | POA: Insufficient documentation

## 2021-07-25 NOTE — Progress Notes (Signed)
MFM Note   [name] was seen due to a spontaneously conceived twin pregnancy.  She denies any significant past medical history and denies any problems in her current pregnancy.  She has a history of a prior LEEP.  The patient had a cell free DNA test that showed atypical findings that is outside the scope of the test involving the sex chromosomes.  [name] has reported that this finding may indicate fetal mosaicism, fetal chromosomal abnormalities, or a normal variation.  These are predicted to be dizygotic/fraternal twins.  Due to the abnormal test, the fetal genders could not be determined.  A thick dividing membrane was noted separating the two fetuses, indicating that these are dichorionic, diamniotic twins.  The overall EFW's obtained for twin A and twin B were in the lower normal range.  The growth discordance between the two fetuses is only 3%.  We will reassess the fetal growth at her next exam.  Her EDC of December 20, 2021 is based on a first trimester ultrasound performed in your office and on her LMP.  There were no obvious anomalies noted in either twin A or twin B today.  However, the views of the fetal anatomy were limited today due to the fetal positions.    A female and female fetus are noted on today's exam.  The limitations of ultrasound in the detection of all anomalies was discussed today.  Due to her prior LEEP, a transvaginal ultrasound performed today shows a cervical length of 3.52 cm long.  Based on today's cervical length, she was reassured that her risk of a preterm birth is low at this time.  The management of dichorionic twins was discussed.  She was advised that management of twin pregnancies will involve frequent ultrasound exams to assess the fetal growth and amniotic fluid level. We will continue to follow her with serial growth ultrasounds.  Weekly fetal testing for dichorionic twins should start at around 36 weeks.  Delivery for uncomplicated dichorionic twins  should occur at around 27 weeks.  The increased risk of preeclampsia, gestational diabetes, and preterm birth/labor associated with twin pregnancies was discussed.  She was advised that she will continue to be followed closely to assess for these conditions. As pregnancies with multiple gestations are at increased risk for developing preeclampsia, she was advised to start taking 1 to 2 tablets of baby aspirin (81 mg per day) to decrease her risk of developing preeclampsia  as soon as possible.  The patient met with our genetic counselor today regarding the atypical findings noted on her cell free DNA test.  They have declined a redraw all of the cell free DNA test and has declined an amniocentesis for definitive diagnosis of fetal chromosomal abnormalities.  A follow-up exam was scheduled in 4 weeks to assess the fetal growth and to complete the views of the fetal anatomy.   A total of 30 minutes was spent counseling and coordinating the care for this patient.  Greater than 50% of the time was spent in direct face-to-face contact.

## 2021-07-26 ENCOUNTER — Other Ambulatory Visit: Payer: Self-pay | Admitting: Obstetrics

## 2021-07-26 DIAGNOSIS — O99891 Other specified diseases and conditions complicating pregnancy: Secondary | ICD-10-CM

## 2021-07-26 DIAGNOSIS — O285 Abnormal chromosomal and genetic finding on antenatal screening of mother: Secondary | ICD-10-CM

## 2021-07-26 DIAGNOSIS — O30042 Twin pregnancy, dichorionic/diamniotic, second trimester: Secondary | ICD-10-CM

## 2021-07-26 DIAGNOSIS — M329 Systemic lupus erythematosus, unspecified: Secondary | ICD-10-CM

## 2021-08-16 ENCOUNTER — Other Ambulatory Visit: Payer: Self-pay

## 2021-08-16 ENCOUNTER — Encounter: Payer: Self-pay | Admitting: *Deleted

## 2021-08-16 ENCOUNTER — Ambulatory Visit: Payer: BC Managed Care – PPO | Attending: Obstetrics

## 2021-08-16 ENCOUNTER — Ambulatory Visit: Payer: BC Managed Care – PPO | Admitting: *Deleted

## 2021-08-16 VITALS — BP 126/61 | HR 89

## 2021-08-16 DIAGNOSIS — O285 Abnormal chromosomal and genetic finding on antenatal screening of mother: Secondary | ICD-10-CM | POA: Diagnosis not present

## 2021-08-16 DIAGNOSIS — O99891 Other specified diseases and conditions complicating pregnancy: Secondary | ICD-10-CM | POA: Diagnosis present

## 2021-08-16 DIAGNOSIS — O30042 Twin pregnancy, dichorionic/diamniotic, second trimester: Secondary | ICD-10-CM | POA: Insufficient documentation

## 2021-08-16 DIAGNOSIS — Z3A22 22 weeks gestation of pregnancy: Secondary | ICD-10-CM

## 2021-08-16 DIAGNOSIS — O30049 Twin pregnancy, dichorionic/diamniotic, unspecified trimester: Secondary | ICD-10-CM | POA: Diagnosis present

## 2021-08-16 DIAGNOSIS — M329 Systemic lupus erythematosus, unspecified: Secondary | ICD-10-CM | POA: Insufficient documentation

## 2021-08-16 DIAGNOSIS — O99892 Other specified diseases and conditions complicating childbirth: Secondary | ICD-10-CM

## 2021-08-17 ENCOUNTER — Other Ambulatory Visit: Payer: Self-pay | Admitting: *Deleted

## 2021-08-17 DIAGNOSIS — O30042 Twin pregnancy, dichorionic/diamniotic, second trimester: Secondary | ICD-10-CM

## 2021-09-06 ENCOUNTER — Other Ambulatory Visit: Payer: Self-pay | Admitting: Obstetrics and Gynecology

## 2021-09-06 ENCOUNTER — Encounter: Payer: Self-pay | Admitting: *Deleted

## 2021-09-06 ENCOUNTER — Other Ambulatory Visit: Payer: Self-pay

## 2021-09-06 ENCOUNTER — Ambulatory Visit: Payer: BC Managed Care – PPO | Admitting: *Deleted

## 2021-09-06 ENCOUNTER — Ambulatory Visit: Payer: BC Managed Care – PPO | Attending: Obstetrics and Gynecology

## 2021-09-06 VITALS — BP 129/68 | HR 81

## 2021-09-06 DIAGNOSIS — O26892 Other specified pregnancy related conditions, second trimester: Secondary | ICD-10-CM | POA: Diagnosis not present

## 2021-09-06 DIAGNOSIS — M329 Systemic lupus erythematosus, unspecified: Secondary | ICD-10-CM | POA: Diagnosis not present

## 2021-09-06 DIAGNOSIS — Z3A25 25 weeks gestation of pregnancy: Secondary | ICD-10-CM

## 2021-09-06 DIAGNOSIS — O30042 Twin pregnancy, dichorionic/diamniotic, second trimester: Secondary | ICD-10-CM | POA: Insufficient documentation

## 2021-09-08 ENCOUNTER — Other Ambulatory Visit: Payer: Self-pay | Admitting: *Deleted

## 2021-09-08 DIAGNOSIS — O3442 Maternal care for other abnormalities of cervix, second trimester: Secondary | ICD-10-CM

## 2021-09-08 DIAGNOSIS — O30042 Twin pregnancy, dichorionic/diamniotic, second trimester: Secondary | ICD-10-CM

## 2021-09-11 ENCOUNTER — Telehealth: Payer: Self-pay

## 2021-09-12 ENCOUNTER — Encounter: Payer: Self-pay | Admitting: *Deleted

## 2021-09-12 ENCOUNTER — Other Ambulatory Visit: Payer: Self-pay | Admitting: *Deleted

## 2021-09-12 ENCOUNTER — Ambulatory Visit: Payer: BC Managed Care – PPO | Attending: Obstetrics and Gynecology

## 2021-09-12 ENCOUNTER — Ambulatory Visit: Payer: BC Managed Care – PPO | Admitting: *Deleted

## 2021-09-12 ENCOUNTER — Other Ambulatory Visit: Payer: Self-pay

## 2021-09-12 VITALS — BP 127/60 | HR 81

## 2021-09-12 DIAGNOSIS — Z3A25 25 weeks gestation of pregnancy: Secondary | ICD-10-CM

## 2021-09-12 DIAGNOSIS — O3442 Maternal care for other abnormalities of cervix, second trimester: Secondary | ICD-10-CM | POA: Diagnosis not present

## 2021-09-12 DIAGNOSIS — O30042 Twin pregnancy, dichorionic/diamniotic, second trimester: Secondary | ICD-10-CM

## 2021-09-12 DIAGNOSIS — O365990 Maternal care for other known or suspected poor fetal growth, unspecified trimester, not applicable or unspecified: Secondary | ICD-10-CM

## 2021-09-12 DIAGNOSIS — O30002 Twin pregnancy, unspecified number of placenta and unspecified number of amniotic sacs, second trimester: Secondary | ICD-10-CM

## 2021-09-20 ENCOUNTER — Ambulatory Visit: Payer: BC Managed Care – PPO | Admitting: *Deleted

## 2021-09-20 ENCOUNTER — Ambulatory Visit: Payer: BC Managed Care – PPO | Attending: Obstetrics and Gynecology

## 2021-09-20 ENCOUNTER — Ambulatory Visit (HOSPITAL_BASED_OUTPATIENT_CLINIC_OR_DEPARTMENT_OTHER): Payer: BC Managed Care – PPO | Admitting: *Deleted

## 2021-09-20 ENCOUNTER — Other Ambulatory Visit: Payer: Self-pay

## 2021-09-20 VITALS — BP 123/49 | HR 84

## 2021-09-20 DIAGNOSIS — O30042 Twin pregnancy, dichorionic/diamniotic, second trimester: Secondary | ICD-10-CM | POA: Diagnosis not present

## 2021-09-20 DIAGNOSIS — O3442 Maternal care for other abnormalities of cervix, second trimester: Secondary | ICD-10-CM | POA: Diagnosis not present

## 2021-09-20 DIAGNOSIS — Z3A27 27 weeks gestation of pregnancy: Secondary | ICD-10-CM | POA: Diagnosis not present

## 2021-09-20 DIAGNOSIS — O365922 Maternal care for other known or suspected poor fetal growth, second trimester, fetus 2: Secondary | ICD-10-CM

## 2021-09-20 NOTE — Procedures (Signed)
AJANEE BUREN 11-20-1985 [redacted]w[redacted]d   [name] Non-Stress Test Interpretation for 09/20/21  Indication: IUGR  Fetal Heart Rate [name] Mode: External Baseline Rate (B): 150 BPM Variability: Moderate Accelerations: 10 x 10 Decelerations: None  Uterine Activity Mode: Palpation, Toco Contraction Frequency (min): none Resting Tone Palpated: Relaxed  Interpretation (Baby B - Fetal Testing) Nonstress Test Interpretation (Baby B): Reactive Overall Impression (Baby B): Reassuring for gestational age Comments (Baby B): Dr. Judeth Cornfield reviewed tracing  LAYLEE SCHOOLEY March 28, 1986 [redacted]w[redacted]d  [name] Non-Stress Test Interpretation for 09/20/21  Indication:  TWINS  Fetal Heart Rate A Mode: External Baseline Rate (A): 155 bpm Variability: Moderate Accelerations: 10 x 10 Decelerations: None Multiple birth?: Yes  Uterine Activity Mode: Palpation, Toco Contraction Frequency (min): none Resting Tone Palpated: Relaxed  Interpretation (Fetal Testing) Nonstress Test Interpretation: Reactive Overall Impression: Reassuring for gestational age Comments: Dr. Judeth Cornfield reviewed tracing

## 2021-09-27 ENCOUNTER — Ambulatory Visit (HOSPITAL_BASED_OUTPATIENT_CLINIC_OR_DEPARTMENT_OTHER): Payer: BC Managed Care – PPO | Admitting: *Deleted

## 2021-09-27 ENCOUNTER — Other Ambulatory Visit: Payer: Self-pay

## 2021-09-27 ENCOUNTER — Ambulatory Visit: Payer: BC Managed Care – PPO | Attending: Obstetrics and Gynecology

## 2021-09-27 ENCOUNTER — Ambulatory Visit: Payer: BC Managed Care – PPO | Admitting: *Deleted

## 2021-09-27 ENCOUNTER — Other Ambulatory Visit: Payer: Self-pay | Admitting: Obstetrics and Gynecology

## 2021-09-27 ENCOUNTER — Encounter: Payer: Self-pay | Admitting: *Deleted

## 2021-09-27 VITALS — BP 131/56 | HR 81

## 2021-09-27 DIAGNOSIS — O365932 Maternal care for other known or suspected poor fetal growth, third trimester, fetus 2: Secondary | ICD-10-CM

## 2021-09-27 DIAGNOSIS — Z3A28 28 weeks gestation of pregnancy: Secondary | ICD-10-CM

## 2021-09-27 DIAGNOSIS — O30043 Twin pregnancy, dichorionic/diamniotic, third trimester: Secondary | ICD-10-CM

## 2021-09-27 DIAGNOSIS — O3442 Maternal care for other abnormalities of cervix, second trimester: Secondary | ICD-10-CM

## 2021-09-27 DIAGNOSIS — O99213 Obesity complicating pregnancy, third trimester: Secondary | ICD-10-CM | POA: Diagnosis present

## 2021-09-27 DIAGNOSIS — O30042 Twin pregnancy, dichorionic/diamniotic, second trimester: Secondary | ICD-10-CM

## 2021-09-27 NOTE — Procedures (Signed)
[name] [birth year] [redacted]w[redacted]d  [name] Non-Stress Test Interpretation for 09/27/21  Indication:  Di/Di twins  Fetal Heart Rate A Mode: External Baseline Rate (A): 155 bpm Variability: Moderate Accelerations: 10 x 10 Decelerations: Variable Multiple birth?: Yes  Uterine Activity Mode: Palpation, Toco Contraction Frequency (min): None Resting Tone Palpated: Relaxed Resting Time: Adequate  Interpretation (Fetal Testing) Nonstress Test Interpretation: Reactive Overall Impression: Reassuring for gestational age Comments: Dr. Parke Poisson reviewed tracing.  [name] 05-09-1986 [redacted]w[redacted]d   [name] Non-Stress Test Interpretation for 09/27/21  Indication: IUGR  Fetal Heart Rate [name] Mode: External Baseline Rate (B): 145 BPM Variability: Moderate Accelerations: 10 x 10 Decelerations: Variable  Uterine Activity Mode: Palpation, Toco Contraction Frequency (min): None Resting Tone Palpated: Relaxed Resting Time: Adequate  Interpretation (Baby B - Fetal Testing) Nonstress Test Interpretation (Baby B): Reactive Overall Impression (Baby B): Reassuring for gestational age Comments (Baby B): Dr. Parke Poisson reviewed tracing.

## 2021-10-04 ENCOUNTER — Other Ambulatory Visit: Payer: Self-pay

## 2021-10-04 ENCOUNTER — Ambulatory Visit: Payer: BC Managed Care – PPO | Admitting: *Deleted

## 2021-10-04 ENCOUNTER — Ambulatory Visit: Payer: BC Managed Care – PPO | Attending: Obstetrics

## 2021-10-04 ENCOUNTER — Ambulatory Visit (HOSPITAL_BASED_OUTPATIENT_CLINIC_OR_DEPARTMENT_OTHER): Payer: BC Managed Care – PPO | Admitting: *Deleted

## 2021-10-04 VITALS — BP 131/54 | HR 82

## 2021-10-04 DIAGNOSIS — O365932 Maternal care for other known or suspected poor fetal growth, third trimester, fetus 2: Secondary | ICD-10-CM | POA: Insufficient documentation

## 2021-10-04 DIAGNOSIS — O3442 Maternal care for other abnormalities of cervix, second trimester: Secondary | ICD-10-CM

## 2021-10-04 DIAGNOSIS — Z3A29 29 weeks gestation of pregnancy: Secondary | ICD-10-CM

## 2021-10-04 DIAGNOSIS — O30043 Twin pregnancy, dichorionic/diamniotic, third trimester: Secondary | ICD-10-CM

## 2021-10-04 DIAGNOSIS — O30042 Twin pregnancy, dichorionic/diamniotic, second trimester: Secondary | ICD-10-CM

## 2021-10-04 NOTE — Procedures (Signed)
[name] 27-Mar-1986 [redacted]w[redacted]d   [name] Non-Stress Test Interpretation for 10/04/21  Indication: IUGR  Fetal Heart Rate [name] Mode: External Baseline Rate (B): 140 BPM Variability: Moderate Accelerations: 10 x 10 Decelerations: None  Uterine Activity Mode: Palpation, Toco Contraction Frequency (min): none Resting Tone Palpated: Relaxed  Interpretation (Baby B - Fetal Testing) Nonstress Test Interpretation (Baby B): Reactive Overall Impression (Baby B): Reassuring for gestational age Comments (Baby B): Dr. Parke Poisson reviewed tracing  [name] 23-Nov-1985 [redacted]w[redacted]d  [name] Non-Stress Test Interpretation for 10/04/21  Indication:  di di twins  Fetal Heart Rate A Mode: External Baseline Rate (A): 150 bpm Variability: Moderate Accelerations: 15 x 15 Decelerations: None Multiple birth?: Yes  Uterine Activity Mode: Palpation, Toco Contraction Frequency (min): none Resting Tone Palpated: Relaxed  Interpretation (Fetal Testing) Nonstress Test Interpretation: Reactive Overall Impression: Reassuring for gestational age Comments: Dr. Parke Poisson reviewed tracing

## 2021-10-10 ENCOUNTER — Ambulatory Visit: Payer: BC Managed Care – PPO | Attending: Obstetrics and Gynecology

## 2021-10-10 ENCOUNTER — Other Ambulatory Visit: Payer: Self-pay | Admitting: *Deleted

## 2021-10-10 ENCOUNTER — Ambulatory Visit (HOSPITAL_BASED_OUTPATIENT_CLINIC_OR_DEPARTMENT_OTHER): Payer: BC Managed Care – PPO | Admitting: *Deleted

## 2021-10-10 ENCOUNTER — Ambulatory Visit: Payer: BC Managed Care – PPO | Admitting: *Deleted

## 2021-10-10 ENCOUNTER — Other Ambulatory Visit: Payer: Self-pay | Admitting: Obstetrics and Gynecology

## 2021-10-10 ENCOUNTER — Other Ambulatory Visit: Payer: Self-pay

## 2021-10-10 VITALS — BP 134/53 | HR 75

## 2021-10-10 DIAGNOSIS — O30043 Twin pregnancy, dichorionic/diamniotic, third trimester: Secondary | ICD-10-CM

## 2021-10-10 DIAGNOSIS — O365932 Maternal care for other known or suspected poor fetal growth, third trimester, fetus 2: Secondary | ICD-10-CM | POA: Insufficient documentation

## 2021-10-10 DIAGNOSIS — O365990 Maternal care for other known or suspected poor fetal growth, unspecified trimester, not applicable or unspecified: Secondary | ICD-10-CM | POA: Diagnosis present

## 2021-10-10 DIAGNOSIS — Z3A29 29 weeks gestation of pregnancy: Secondary | ICD-10-CM

## 2021-10-10 DIAGNOSIS — O30002 Twin pregnancy, unspecified number of placenta and unspecified number of amniotic sacs, second trimester: Secondary | ICD-10-CM

## 2021-10-10 DIAGNOSIS — O365930 Maternal care for other known or suspected poor fetal growth, third trimester, not applicable or unspecified: Secondary | ICD-10-CM

## 2021-10-10 DIAGNOSIS — O99213 Obesity complicating pregnancy, third trimester: Secondary | ICD-10-CM

## 2021-10-10 NOTE — Procedures (Signed)
SHEQUILLA GOODGAME 28-Jul-1986 [redacted]w[redacted]d   [name] Non-Stress Test Interpretation for 10/10/21  Indication: IUGR  Fetal Heart Rate [name] Baseline Rate (B): 135 BPM Variability: Moderate Accelerations: 10 x 10 Decelerations: None  Uterine Activity Mode: Palpation, Toco Contraction Frequency (min): none Resting Tone Palpated: Relaxed  Interpretation (Baby B - Fetal Testing) Nonstress Test Interpretation (Baby B): Reactive Overall Impression (Baby B): Reassuring for gestational age Comments (Baby B): Dr. Judeth Cornfield reviewed tracing  BREDA BOND 11-Feb-1986 [redacted]w[redacted]d  [name] Non-Stress Test Interpretation for 10/10/21  Indication:  di di twins  Fetal Heart Rate A Mode: External Baseline Rate (A): 145 bpm Variability: Moderate Accelerations: 15 x 15 Decelerations: None Multiple birth?: Yes  Uterine Activity Mode: Palpation, Toco Contraction Frequency (min): none Resting Tone Palpated: Relaxed  Interpretation (Fetal Testing) Nonstress Test Interpretation: Reactive Comments: Dr. Judeth Cornfield reviewed tracing

## 2021-10-11 ENCOUNTER — Other Ambulatory Visit: Payer: Self-pay | Admitting: *Deleted

## 2021-10-11 DIAGNOSIS — O99213 Obesity complicating pregnancy, third trimester: Secondary | ICD-10-CM

## 2021-10-11 DIAGNOSIS — O365932 Maternal care for other known or suspected poor fetal growth, third trimester, fetus 2: Secondary | ICD-10-CM

## 2021-10-11 DIAGNOSIS — O30043 Twin pregnancy, dichorionic/diamniotic, third trimester: Secondary | ICD-10-CM

## 2021-10-17 ENCOUNTER — Ambulatory Visit (HOSPITAL_BASED_OUTPATIENT_CLINIC_OR_DEPARTMENT_OTHER): Payer: BC Managed Care – PPO | Admitting: *Deleted

## 2021-10-17 ENCOUNTER — Other Ambulatory Visit: Payer: Self-pay

## 2021-10-17 ENCOUNTER — Encounter: Payer: Self-pay | Admitting: *Deleted

## 2021-10-17 ENCOUNTER — Ambulatory Visit: Payer: BC Managed Care – PPO | Admitting: *Deleted

## 2021-10-17 ENCOUNTER — Ambulatory Visit: Payer: BC Managed Care – PPO | Attending: Obstetrics and Gynecology

## 2021-10-17 ENCOUNTER — Other Ambulatory Visit: Payer: Self-pay | Admitting: *Deleted

## 2021-10-17 VITALS — BP 128/63 | HR 79

## 2021-10-17 DIAGNOSIS — O30002 Twin pregnancy, unspecified number of placenta and unspecified number of amniotic sacs, second trimester: Secondary | ICD-10-CM | POA: Diagnosis present

## 2021-10-17 DIAGNOSIS — O30043 Twin pregnancy, dichorionic/diamniotic, third trimester: Secondary | ICD-10-CM

## 2021-10-17 DIAGNOSIS — O365992 Maternal care for other known or suspected poor fetal growth, unspecified trimester, fetus 2: Secondary | ICD-10-CM | POA: Diagnosis not present

## 2021-10-17 DIAGNOSIS — O365990 Maternal care for other known or suspected poor fetal growth, unspecified trimester, not applicable or unspecified: Secondary | ICD-10-CM | POA: Insufficient documentation

## 2021-10-17 DIAGNOSIS — O365932 Maternal care for other known or suspected poor fetal growth, third trimester, fetus 2: Secondary | ICD-10-CM

## 2021-10-17 DIAGNOSIS — O99213 Obesity complicating pregnancy, third trimester: Secondary | ICD-10-CM

## 2021-10-17 DIAGNOSIS — E039 Hypothyroidism, unspecified: Secondary | ICD-10-CM | POA: Insufficient documentation

## 2021-10-17 DIAGNOSIS — D6862 Lupus anticoagulant syndrome: Secondary | ICD-10-CM | POA: Insufficient documentation

## 2021-10-17 DIAGNOSIS — Z3A30 30 weeks gestation of pregnancy: Secondary | ICD-10-CM

## 2021-10-17 DIAGNOSIS — O99280 Endocrine, nutritional and metabolic diseases complicating pregnancy, unspecified trimester: Secondary | ICD-10-CM

## 2021-10-17 DIAGNOSIS — O99113 Other diseases of the blood and blood-forming organs and certain disorders involving the immune mechanism complicating pregnancy, third trimester: Secondary | ICD-10-CM

## 2021-10-17 NOTE — Procedures (Signed)
MARLANE HIRSCHMANN 05-03-1986 [redacted]w[redacted]d   [name] Non-Stress Test Interpretation for 10/17/21  Indication: IUGR  Fetal Heart Rate [name] Mode: External Baseline Rate (B): 145 BPM Variability: Moderate Accelerations: 10 x 10 Decelerations: None  Uterine Activity Mode: Palpation, Toco Contraction Frequency (min): UI Contraction Quality: Mild Resting Tone Palpated: Relaxed Resting Time: Adequate  Interpretation (Baby B - Fetal Testing) Nonstress Test Interpretation (Baby B): Reactive Overall Impression (Baby B): Reassuring for gestational age Comments (Baby B): Dr. Grace Bushy reviewed tracing.  CYANNA NEACE Mar 15, 1986 [redacted]w[redacted]d  [name] Non-Stress Test Interpretation for 10/17/21  Indication:  Di/Di twins, lupus, hypothyroidism  Fetal Heart Rate A Mode: External Baseline Rate (A): 150 bpm Variability: Moderate Accelerations: 10 x 10 Decelerations: None Multiple birth?: Yes  Uterine Activity Mode: Palpation, Toco Contraction Frequency (min): UI Contraction Quality: Mild Resting Tone Palpated: Relaxed Resting Time: Adequate  Interpretation (Fetal Testing) Nonstress Test Interpretation: Reactive Overall Impression: Reassuring for gestational age Comments: Dr. Grace Bushy reviewed tracing.

## 2021-10-20 LAB — OB RESULTS CONSOLE GBS: GBS: NEGATIVE

## 2021-10-25 ENCOUNTER — Ambulatory Visit: Payer: BC Managed Care – PPO | Admitting: *Deleted

## 2021-10-25 ENCOUNTER — Other Ambulatory Visit: Payer: Self-pay

## 2021-10-25 ENCOUNTER — Encounter: Payer: Self-pay | Admitting: *Deleted

## 2021-10-25 ENCOUNTER — Ambulatory Visit: Payer: BC Managed Care – PPO | Attending: Obstetrics and Gynecology

## 2021-10-25 VITALS — BP 123/65 | HR 76

## 2021-10-25 DIAGNOSIS — O99213 Obesity complicating pregnancy, third trimester: Secondary | ICD-10-CM | POA: Insufficient documentation

## 2021-10-25 DIAGNOSIS — E669 Obesity, unspecified: Secondary | ICD-10-CM

## 2021-10-25 DIAGNOSIS — O30043 Twin pregnancy, dichorionic/diamniotic, third trimester: Secondary | ICD-10-CM

## 2021-10-25 DIAGNOSIS — O365932 Maternal care for other known or suspected poor fetal growth, third trimester, fetus 2: Secondary | ICD-10-CM | POA: Insufficient documentation

## 2021-10-25 DIAGNOSIS — Z3A32 32 weeks gestation of pregnancy: Secondary | ICD-10-CM

## 2021-10-25 NOTE — Procedures (Signed)
[name] 04/28/1986 [redacted]w[redacted]d   [name] Non-Stress Test Interpretation for 10/25/21  Indication: IUGR  Fetal Heart Rate [name] Mode: External Baseline Rate (B): 140 BPM Variability: Moderate Accelerations: 15 x 15 Decelerations: None  Uterine Activity Mode: Toco Contraction Frequency (min): none Resting Tone Palpated: Relaxed  Interpretation (Baby B - Fetal Testing) Nonstress Test Interpretation (Baby B): Reactive Overall Impression (Baby B): Reassuring for gestational age Comments (Baby B): tracing reviewed by Dr. Damian Leavell 05-04-1986 [redacted]w[redacted]d  [name] Non-Stress Test Interpretation for 10/25/21  Indication:di di twin  Fetal Heart Rate A Mode: External Baseline Rate (A): 150 bpm Variability: Moderate Accelerations: 15 x 15 Decelerations: None Multiple birth?: Yes  Uterine Activity Mode: Toco Contraction Frequency (min): none Resting Tone Palpated: Relaxed  Interpretation (Fetal Testing) Nonstress Test Interpretation: Reactive Overall Impression: Reassuring for gestational age Comments: tracing reviewed by Dr. Annamaria Boots

## 2021-10-25 NOTE — Progress Notes (Signed)
Pt. States" not sure if feeling both babies move or just one." NST to be performed after U/S.

## 2021-10-31 ENCOUNTER — Ambulatory Visit (HOSPITAL_BASED_OUTPATIENT_CLINIC_OR_DEPARTMENT_OTHER): Payer: BC Managed Care – PPO | Admitting: *Deleted

## 2021-10-31 ENCOUNTER — Ambulatory Visit: Payer: BC Managed Care – PPO | Attending: Obstetrics and Gynecology

## 2021-10-31 ENCOUNTER — Other Ambulatory Visit: Payer: Self-pay | Admitting: Obstetrics and Gynecology

## 2021-10-31 ENCOUNTER — Other Ambulatory Visit: Payer: Self-pay

## 2021-10-31 ENCOUNTER — Ambulatory Visit: Payer: BC Managed Care – PPO | Admitting: *Deleted

## 2021-10-31 ENCOUNTER — Encounter: Payer: Self-pay | Admitting: *Deleted

## 2021-10-31 VITALS — BP 136/74 | HR 89

## 2021-10-31 DIAGNOSIS — E039 Hypothyroidism, unspecified: Secondary | ICD-10-CM

## 2021-10-31 DIAGNOSIS — O99283 Endocrine, nutritional and metabolic diseases complicating pregnancy, third trimester: Secondary | ICD-10-CM

## 2021-10-31 DIAGNOSIS — O99113 Other diseases of the blood and blood-forming organs and certain disorders involving the immune mechanism complicating pregnancy, third trimester: Secondary | ICD-10-CM | POA: Diagnosis not present

## 2021-10-31 DIAGNOSIS — O30043 Twin pregnancy, dichorionic/diamniotic, third trimester: Secondary | ICD-10-CM

## 2021-10-31 DIAGNOSIS — O99213 Obesity complicating pregnancy, third trimester: Secondary | ICD-10-CM

## 2021-10-31 DIAGNOSIS — E669 Obesity, unspecified: Secondary | ICD-10-CM | POA: Diagnosis not present

## 2021-10-31 DIAGNOSIS — O365932 Maternal care for other known or suspected poor fetal growth, third trimester, fetus 2: Secondary | ICD-10-CM | POA: Diagnosis present

## 2021-10-31 DIAGNOSIS — D6862 Lupus anticoagulant syndrome: Secondary | ICD-10-CM

## 2021-10-31 DIAGNOSIS — Z3A32 32 weeks gestation of pregnancy: Secondary | ICD-10-CM

## 2021-10-31 NOTE — Procedures (Signed)
BENTLI MCCOSKEY [birth year] [redacted]w[redacted]d  [name] Non-Stress Test Interpretation for 10/31/21  Indication:  Di/Di twins, Lupus, hypothyroidism  Fetal Heart Rate A Mode: External Baseline Rate (A): 150 bpm Variability: Moderate Accelerations: 15 x 15 Decelerations: None Multiple birth?: Yes  Uterine Activity Mode: Palpation, Toco Contraction Frequency (min): UI Contraction Quality: Mild Resting Tone Palpated: Relaxed Resting Time: Adequate  Interpretation (Fetal Testing) Nonstress Test Interpretation: Reactive Comments: Tracing reviewed by Dr. Wolfgang Phoenix 03/24/1986 [redacted]w[redacted]d   [name] Non-Stress Test Interpretation for 10/31/21  Indication: IUGR  Fetal Heart Rate [name] Mode: External Baseline Rate (B): 145 BPM Variability: Moderate Accelerations: 15 x 15 Decelerations: None  Uterine Activity Mode: Palpation, Toco Contraction Frequency (min): UI Contraction Quality: Mild Resting Tone Palpated: Relaxed Resting Time: Adequate  Interpretation (Baby B - Fetal Testing) Nonstress Test Interpretation (Baby B): Reactive Comments (Baby B): Dr. Gertie Exon reviewed tracing.

## 2021-11-08 ENCOUNTER — Ambulatory Visit: Payer: BC Managed Care – PPO | Attending: Obstetrics and Gynecology

## 2021-11-08 ENCOUNTER — Ambulatory Visit: Payer: BC Managed Care – PPO | Admitting: *Deleted

## 2021-11-08 ENCOUNTER — Other Ambulatory Visit: Payer: Self-pay | Admitting: *Deleted

## 2021-11-08 ENCOUNTER — Ambulatory Visit (HOSPITAL_BASED_OUTPATIENT_CLINIC_OR_DEPARTMENT_OTHER): Payer: BC Managed Care – PPO | Admitting: *Deleted

## 2021-11-08 ENCOUNTER — Encounter: Payer: Self-pay | Admitting: *Deleted

## 2021-11-08 ENCOUNTER — Other Ambulatory Visit: Payer: Self-pay

## 2021-11-08 ENCOUNTER — Other Ambulatory Visit: Payer: Self-pay | Admitting: Obstetrics and Gynecology

## 2021-11-08 VITALS — BP 134/65 | HR 78

## 2021-11-08 DIAGNOSIS — O99213 Obesity complicating pregnancy, third trimester: Secondary | ICD-10-CM | POA: Diagnosis present

## 2021-11-08 DIAGNOSIS — O365932 Maternal care for other known or suspected poor fetal growth, third trimester, fetus 2: Secondary | ICD-10-CM

## 2021-11-08 DIAGNOSIS — O30043 Twin pregnancy, dichorionic/diamniotic, third trimester: Secondary | ICD-10-CM

## 2021-11-08 DIAGNOSIS — Z3A34 34 weeks gestation of pregnancy: Secondary | ICD-10-CM

## 2021-11-08 NOTE — Procedures (Signed)
[name] 17-May-1986 [redacted]w[redacted]d   [name] Non-Stress Test Interpretation for 11/08/21  Indication: IUGR  Fetal Heart Rate [name] Mode: External Baseline Rate (B): 130 BPM Variability: Moderate Accelerations: 15 x 15 Decelerations: Variable  Uterine Activity Mode: Palpation, Toco Contraction Frequency (min): ui Resting Tone Palpated: Relaxed  Interpretation (Baby B - Fetal Testing) Nonstress Test Interpretation (Baby B): Reactive Overall Impression (Baby B): Reassuring for gestational age Comments (Baby B): Dr. Grace Bushy reviewed tracing  [name] [birth year] [redacted]w[redacted]d  [name] Non-Stress Test Interpretation for 11/08/21  Indication:  di di twins  Fetal Heart Rate A Mode: External Baseline Rate (A): 150 bpm Variability: Moderate Accelerations: 15 x 15 Decelerations: None Multiple birth?: Yes  Uterine Activity Mode: Palpation, Toco Contraction Frequency (min): ui Resting Tone Palpated: Relaxed  Interpretation (Fetal Testing) Nonstress Test Interpretation: Reactive Overall Impression: Reassuring for gestational age Comments: Dr. Grace Bushy reviewed tracing.

## 2021-11-16 ENCOUNTER — Ambulatory Visit: Payer: BC Managed Care – PPO | Attending: Maternal & Fetal Medicine

## 2021-11-16 ENCOUNTER — Encounter: Payer: Self-pay | Admitting: *Deleted

## 2021-11-16 ENCOUNTER — Ambulatory Visit: Payer: BC Managed Care – PPO | Admitting: *Deleted

## 2021-11-16 ENCOUNTER — Other Ambulatory Visit: Payer: Self-pay

## 2021-11-16 VITALS — BP 132/63 | HR 78

## 2021-11-16 DIAGNOSIS — O365932 Maternal care for other known or suspected poor fetal growth, third trimester, fetus 2: Secondary | ICD-10-CM | POA: Diagnosis not present

## 2021-11-16 DIAGNOSIS — O365912 Maternal care for other known or suspected poor fetal growth, first trimester, fetus 2: Secondary | ICD-10-CM | POA: Diagnosis present

## 2021-11-16 DIAGNOSIS — O30043 Twin pregnancy, dichorionic/diamniotic, third trimester: Secondary | ICD-10-CM | POA: Insufficient documentation

## 2021-11-16 DIAGNOSIS — Z3A35 35 weeks gestation of pregnancy: Secondary | ICD-10-CM | POA: Diagnosis not present

## 2021-11-16 NOTE — Procedures (Signed)
[name] [birth year] [redacted]w[redacted]d   [name] Non-Stress Test Interpretation for 11/16/21  Indication:  twin di di  Fetal Heart Rate [name] Mode: External Baseline Rate (B): 145 BPM Variability: Moderate Accelerations: 15 x 15 Decelerations: None  Uterine Activity Mode: Toco Contraction Frequency (min): UI Resting Tone Palpated: Relaxed  Interpretation (Baby B - Fetal Testing) Nonstress Test Interpretation (Baby B): Reactive Overall Impression (Baby B): Reassuring for gestational age Comments (Baby B): tracing reviewed by Dr. Darien Ramus [birth year] [redacted]w[redacted]d  [name] Non-Stress Test Interpretation for 11/16/21  Indication: IUGR  Fetal Heart Rate A Mode: External Baseline Rate (A): 150 bpm Variability: Moderate Accelerations: 15 x 15 Decelerations: None Multiple birth?: Yes  Uterine Activity Mode: Toco Contraction Frequency (min): UI Resting Tone Palpated: Relaxed  Interpretation (Fetal Testing) Nonstress Test Interpretation: Reactive Overall Impression: Reassuring for gestational age Comments: tracing reviewed by Dr. Parke Poisson

## 2021-11-17 ENCOUNTER — Encounter (HOSPITAL_COMMUNITY): Payer: Self-pay

## 2021-11-17 NOTE — Patient Instructions (Signed)
[name]  11/17/2021   Your procedure is scheduled on:  11/24/2021  Arrive at 0600 at Entrance C on [address] at [facility]  and [employer]. You are invited to use the FREE valet parking or use the Visitor's parking deck.  Pick up the phone at the desk and dial [phone].  Call this number if you have problems the morning of surgery: [phone]  Remember:   Do not eat food:(After Midnight) Desps de medianoche.  Do not drink clear liquids: (After Midnight) Desps de medianoche.  Take these medicines the morning of surgery with A SIP OF WATER:  Take levothyroxine as prescribed   Do not wear jewelry, make-up or nail polish.  Do not wear lotions, powders, or perfumes. Do not wear deodorant.  Do not shave 48 hours prior to surgery.  Do not bring valuables to the hospital.  [facility] is not   responsible for any belongings or valuables brought to the hospital.  Contacts, dentures or bridgework may not be worn into surgery.  Leave suitcase in the car. After surgery it may be brought to your room.  For patients admitted to the hospital, checkout time is 11:00 AM the day of              discharge.      Please read over the following fact sheets that you were given:     Preparing for Surgery

## 2021-11-20 ENCOUNTER — Other Ambulatory Visit: Payer: Self-pay | Admitting: Obstetrics & Gynecology

## 2021-11-21 ENCOUNTER — Encounter (HOSPITAL_COMMUNITY): Payer: Self-pay | Admitting: Obstetrics & Gynecology

## 2021-11-22 ENCOUNTER — Other Ambulatory Visit (HOSPITAL_COMMUNITY)
Admission: RE | Admit: 2021-11-22 | Discharge: 2021-11-22 | Disposition: A | Discharge status: Home / Self Care | Payer: BC Managed Care – PPO | Source: Ambulatory Visit | Attending: Obstetrics & Gynecology | Admitting: Obstetrics & Gynecology

## 2021-11-22 ENCOUNTER — Other Ambulatory Visit: Payer: Self-pay

## 2021-11-22 ENCOUNTER — Other Ambulatory Visit: Payer: Self-pay | Admitting: Obstetrics & Gynecology

## 2021-11-22 DIAGNOSIS — Z20822 Contact with and (suspected) exposure to covid-19: Secondary | ICD-10-CM | POA: Insufficient documentation

## 2021-11-22 HISTORY — DX: Hypothyroidism, unspecified: E03.9

## 2021-11-22 HISTORY — DX: Other reaction to spinal and lumbar puncture: G97.1

## 2021-11-22 LAB — CBC
HCT: 35.7 % — ABNORMAL LOW (ref 36.0–46.0)
Hemoglobin: 11.3 g/dL — ABNORMAL LOW (ref 12.0–15.0)
MCH: 28.8 pg (ref 26.0–34.0)
MCHC: 31.7 g/dL (ref 30.0–36.0)
MCV: 91.1 fL (ref 80.0–100.0)
Platelets: 161 10*3/uL (ref 150–400)
RBC: 3.92 MIL/uL (ref 3.87–5.11)
RDW: 14.9 % (ref 11.5–15.5)
WBC: 7.6 10*3/uL (ref 4.0–10.5)
nRBC: 0 % (ref 0.0–0.2)

## 2021-11-22 LAB — TYPE AND SCREEN
ABO/RH(D): A NEG
Antibody Screen: NEGATIVE

## 2021-11-22 LAB — RPR: RPR Ser Ql: NONREACTIVE

## 2021-11-22 LAB — SARS CORONAVIRUS 2 (TAT 6-24 HRS): SARS Coronavirus 2: NEGATIVE

## 2021-11-23 ENCOUNTER — Ambulatory Visit (HOSPITAL_BASED_OUTPATIENT_CLINIC_OR_DEPARTMENT_OTHER): Payer: BC Managed Care – PPO | Admitting: *Deleted

## 2021-11-23 ENCOUNTER — Encounter: Payer: Self-pay | Admitting: *Deleted

## 2021-11-23 ENCOUNTER — Ambulatory Visit (HOSPITAL_BASED_OUTPATIENT_CLINIC_OR_DEPARTMENT_OTHER): Payer: BC Managed Care – PPO

## 2021-11-23 ENCOUNTER — Encounter (HOSPITAL_COMMUNITY): Payer: Self-pay | Admitting: Obstetrics & Gynecology

## 2021-11-23 ENCOUNTER — Ambulatory Visit: Payer: BC Managed Care – PPO | Admitting: *Deleted

## 2021-11-23 ENCOUNTER — Ambulatory Visit (HOSPITAL_BASED_OUTPATIENT_CLINIC_OR_DEPARTMENT_OTHER): Payer: BC Managed Care – PPO | Admitting: Maternal & Fetal Medicine

## 2021-11-23 VITALS — BP 141/64 | HR 92

## 2021-11-23 DIAGNOSIS — O365932 Maternal care for other known or suspected poor fetal growth, third trimester, fetus 2: Secondary | ICD-10-CM

## 2021-11-23 DIAGNOSIS — O365912 Maternal care for other known or suspected poor fetal growth, first trimester, fetus 2: Secondary | ICD-10-CM | POA: Insufficient documentation

## 2021-11-23 DIAGNOSIS — Z3A36 36 weeks gestation of pregnancy: Secondary | ICD-10-CM | POA: Diagnosis not present

## 2021-11-23 DIAGNOSIS — O30043 Twin pregnancy, dichorionic/diamniotic, third trimester: Secondary | ICD-10-CM

## 2021-11-23 NOTE — Anesthesia Preprocedure Evaluation (Addendum)
Anesthesia Evaluation  Patient identified by MRN, date of birth, ID band Patient awake    Reviewed: Allergy & Precautions, NPO status , Patient's Chart, lab work & pertinent test results  History of Anesthesia Complications (+) history of anesthetic complications (PDPH requiring a blood patch)  Airway Mallampati: II  TM Distance: >3 FB Neck ROM: Full    Dental no notable dental hx.    Pulmonary neg pulmonary ROS, former smoker,    Pulmonary exam normal breath sounds clear to auscultation       Cardiovascular Exercise Tolerance: Good negative cardio ROS Normal cardiovascular exam Rhythm:Regular Rate:Normal     Neuro/Psych PSYCHIATRIC DISORDERS Anxiety negative neurological ROS  negative psych ROS   GI/Hepatic negative GI ROS, Neg liver ROS,   Endo/Other  negative endocrine ROSHypothyroidism Morbid obesity (BMI 51)  Renal/GU negative Renal ROS  negative genitourinary   Musculoskeletal negative musculoskeletal ROS (+)   Abdominal   Peds negative pediatric ROS (+)  Hematology negative hematology ROS (+) anemia ,   Anesthesia Other Findings Lupus  Reproductive/Obstetrics negative OB ROS                          Anesthesia Physical Anesthesia Plan  ASA: 4  Anesthesia Plan: Spinal   Post-op Pain Management:    Induction: Intravenous  PONV Risk Score and Plan: 2 and Treatment may vary due to age or medical condition, Scopolamine patch - Pre-op, Ondansetron and Dexamethasone  Airway Management Planned: Natural Airway  Additional Equipment:   Intra-op Plan:   Post-operative Plan:   Informed Consent: I have reviewed the patients History and Physical, chart, labs and discussed the procedure including the risks, benefits and alternatives for the proposed anesthesia with the patient or authorized representative who has indicated his/her understanding and acceptance.     Dental advisory  given  Plan Discussed with: Anesthesiologist, CRNA and Surgeon  Anesthesia Plan Comments:        Anesthesia Quick Evaluation

## 2021-11-23 NOTE — Procedures (Signed)
[name] 1986-09-15 [redacted]w[redacted]d   [name] Non-Stress Test Interpretation for 11/23/21  Indication: IUGR  Fetal Heart Rate [name] Mode: External Baseline Rate (B): 145 BPM Variability: Moderate Accelerations: 15 x 15 Decelerations: None  Uterine Activity Mode: Toco Contraction Frequency (min): none Resting Tone Palpated: Relaxed  Interpretation (Baby B - Fetal Testing) Nonstress Test Interpretation (Baby B): Reactive Overall Impression (Baby B): Reassuring for gestational age Comments (Baby B): tracing reviewed by Dr. Vaughan Basta 1986/06/11 [redacted]w[redacted]d  [name] Non-Stress Test Interpretation for 11/23/21  Indication:  obese  Fetal Heart Rate A Mode: External Baseline Rate (A): 150 bpm Variability: Moderate Accelerations: 15 x 15 Decelerations: None Multiple birth?: Yes  Uterine Activity Mode: Toco Contraction Frequency (min): none Resting Tone Palpated: Relaxed  Interpretation (Fetal Testing) Nonstress Test Interpretation: Reactive Overall Impression: Reassuring for gestational age Comments: tracing reviewed by Dr. Grace Bushy

## 2021-11-23 NOTE — Progress Notes (Signed)
Monochorionic diamniotic twin gestation MFM Brief Note  Ms. Slevin is here with DiDi twins for antental testing given FGR of twin B.   She is seen at the request of Dr. Essie Hart  Antenatal testing for dichorionic diamnoitic twin pregnancy with IUGR in Twin B. Twin A normal stomach, amniotic fluid, bladder and BPP 10/10- Maternal right, Cephalic Twin B normal stomach, amniotic fluid, bladder and BPP 10/10- Maternal left, Cephalic  The UA dopplers demonstrated forward flow without evidence of AEDF or REDF.   Ms. Tofte blood pressure was 144/64 and  141/93 mmHg. She reported having a headache on Saturday that resolved with tyelenol. She has a mild headache this morning. She will take 500 mg of tyelenol today, if it doesn't resolve she will go to the MAU.  She is scheduled for delivery tomorrow via cesarean delivery.  I spent 15 mintues with > 50% in face to face consultation.  I also discussed this plan of care with Dr. Essie Hart.  Kinsley Holderman J. Pearlina Friedly,MD

## 2021-11-24 ENCOUNTER — Inpatient Hospital Stay (HOSPITAL_COMMUNITY): Payer: BC Managed Care – PPO | Admitting: Anesthesiology

## 2021-11-24 ENCOUNTER — Encounter (HOSPITAL_COMMUNITY): Payer: Self-pay | Admitting: Obstetrics & Gynecology

## 2021-11-24 ENCOUNTER — Other Ambulatory Visit: Payer: Self-pay

## 2021-11-24 ENCOUNTER — Encounter (HOSPITAL_COMMUNITY)
Admission: RE | Disposition: A | Discharge status: Home / Self Care | Payer: Self-pay | Source: Home / Self Care | Attending: Obstetrics & Gynecology

## 2021-11-24 ENCOUNTER — Inpatient Hospital Stay (HOSPITAL_COMMUNITY)
Admission: RE | Admit: 2021-11-24 | Discharge: 2021-11-26 | DRG: 784 | Disposition: A | Discharge status: Home / Self Care | Payer: BC Managed Care – PPO | Attending: Obstetrics & Gynecology | Admitting: Obstetrics & Gynecology

## 2021-11-24 DIAGNOSIS — O99892 Other specified diseases and conditions complicating childbirth: Secondary | ICD-10-CM | POA: Diagnosis present

## 2021-11-24 DIAGNOSIS — O30033 Twin pregnancy, monochorionic/diamniotic, third trimester: Secondary | ICD-10-CM | POA: Diagnosis present

## 2021-11-24 DIAGNOSIS — O99284 Endocrine, nutritional and metabolic diseases complicating childbirth: Secondary | ICD-10-CM | POA: Diagnosis present

## 2021-11-24 DIAGNOSIS — O99214 Obesity complicating childbirth: Secondary | ICD-10-CM | POA: Diagnosis present

## 2021-11-24 DIAGNOSIS — Z6791 Unspecified blood type, Rh negative: Secondary | ICD-10-CM | POA: Diagnosis not present

## 2021-11-24 DIAGNOSIS — O26893 Other specified pregnancy related conditions, third trimester: Secondary | ICD-10-CM | POA: Diagnosis present

## 2021-11-24 DIAGNOSIS — O365930 Maternal care for other known or suspected poor fetal growth, third trimester, not applicable or unspecified: Secondary | ICD-10-CM | POA: Diagnosis present

## 2021-11-24 DIAGNOSIS — M329 Systemic lupus erythematosus, unspecified: Secondary | ICD-10-CM | POA: Diagnosis present

## 2021-11-24 DIAGNOSIS — E039 Hypothyroidism, unspecified: Secondary | ICD-10-CM | POA: Diagnosis present

## 2021-11-24 DIAGNOSIS — Z3A36 36 weeks gestation of pregnancy: Secondary | ICD-10-CM

## 2021-11-24 DIAGNOSIS — D62 Acute posthemorrhagic anemia: Secondary | ICD-10-CM | POA: Diagnosis not present

## 2021-11-24 DIAGNOSIS — O365990 Maternal care for other known or suspected poor fetal growth, unspecified trimester, not applicable or unspecified: Secondary | ICD-10-CM | POA: Diagnosis present

## 2021-11-24 DIAGNOSIS — Z87891 Personal history of nicotine dependence: Secondary | ICD-10-CM

## 2021-11-24 DIAGNOSIS — Z98891 History of uterine scar from previous surgery: Secondary | ICD-10-CM

## 2021-11-24 DIAGNOSIS — Z8659 Personal history of other mental and behavioral disorders: Secondary | ICD-10-CM

## 2021-11-24 DIAGNOSIS — O365932 Maternal care for other known or suspected poor fetal growth, third trimester, fetus 2: Secondary | ICD-10-CM | POA: Diagnosis present

## 2021-11-24 DIAGNOSIS — Z302 Encounter for sterilization: Secondary | ICD-10-CM

## 2021-11-24 DIAGNOSIS — O134 Gestational [pregnancy-induced] hypertension without significant proteinuria, complicating childbirth: Secondary | ICD-10-CM | POA: Diagnosis present

## 2021-11-24 DIAGNOSIS — O9081 Anemia of the puerperium: Secondary | ICD-10-CM | POA: Diagnosis not present

## 2021-11-24 DIAGNOSIS — O139 Gestational [pregnancy-induced] hypertension without significant proteinuria, unspecified trimester: Secondary | ICD-10-CM | POA: Diagnosis present

## 2021-11-24 HISTORY — DX: Other reaction to spinal and lumbar puncture: G97.1

## 2021-11-24 HISTORY — DX: Irritable bowel syndrome, unspecified: K58.9

## 2021-11-24 HISTORY — DX: Anxiety disorder, unspecified: F41.9

## 2021-11-24 SURGERY — Surgical Case
Anesthesia: Spinal

## 2021-11-24 MED ORDER — RHO D IMMUNE GLOBULIN 1500 UNIT/2ML IJ SOSY
300.0000 ug | PREFILLED_SYRINGE | Freq: Once | INTRAMUSCULAR | Status: DC
  Filled 2021-11-24: qty 2

## 2021-11-24 MED ORDER — ZOLPIDEM TARTRATE 5 MG PO TABS: 5.0000 mg | ORAL_TABLET | Freq: Every evening | ORAL | Status: DC | PRN

## 2021-11-24 MED ORDER — BUPIVACAINE IN DEXTROSE 0.75-8.25 % IT SOLN
INTRATHECAL | Status: DC | PRN
  Administered 2021-11-24: 1.6 mL via INTRATHECAL

## 2021-11-24 MED ORDER — BUPIVACAINE HCL (PF) 0.5 % IJ SOLN
INTRAMUSCULAR | Status: AC
  Filled 2021-11-24: qty 30

## 2021-11-24 MED ORDER — CEFAZOLIN IN SODIUM CHLORIDE 3-0.9 GM/100ML-% IV SOLN
INTRAVENOUS | Status: AC
  Filled 2021-11-24: qty 100

## 2021-11-24 MED ORDER — LACTATED RINGERS IV SOLN: INTRAVENOUS | Status: DC

## 2021-11-24 MED ORDER — WITCH HAZEL-GLYCERIN EX PADS: 1.0000 "application " | MEDICATED_PAD | CUTANEOUS | Status: DC | PRN

## 2021-11-24 MED ORDER — BUPIVACAINE HCL 0.5 % IJ SOLN
INTRAMUSCULAR | Status: DC | PRN
  Administered 2021-11-24: 26 mL

## 2021-11-24 MED ORDER — HYDROMORPHONE HCL 1 MG/ML IJ SOLN: 0.2000 mg | INTRAMUSCULAR | Status: DC | PRN

## 2021-11-24 MED ORDER — DEXAMETHASONE SODIUM PHOSPHATE 4 MG/ML IJ SOLN
INTRAMUSCULAR | Status: AC
  Filled 2021-11-24: qty 1

## 2021-11-24 MED ORDER — DIPHENHYDRAMINE HCL 25 MG PO CAPS: 25.0000 mg | ORAL_CAPSULE | Freq: Four times a day (QID) | ORAL | Status: DC | PRN

## 2021-11-24 MED ORDER — KETOROLAC TROMETHAMINE 30 MG/ML IJ SOLN
30.0000 mg | Freq: Four times a day (QID) | INTRAMUSCULAR | Status: AC | PRN
  Administered 2021-11-24: 30 mg via INTRAVENOUS

## 2021-11-24 MED ORDER — ONDANSETRON HCL 4 MG/2ML IJ SOLN
INTRAMUSCULAR | Status: DC | PRN
  Administered 2021-11-24: 4 mg via INTRAVENOUS

## 2021-11-24 MED ORDER — OXYTOCIN-SODIUM CHLORIDE 30-0.9 UT/500ML-% IV SOLN
INTRAVENOUS | Status: DC | PRN
  Administered 2021-11-24: 300 mL via INTRAVENOUS

## 2021-11-24 MED ORDER — FENTANYL CITRATE (PF) 100 MCG/2ML IJ SOLN
INTRAMUSCULAR | Status: DC | PRN
  Administered 2021-11-24: 15 ug via INTRAVENOUS

## 2021-11-24 MED ORDER — PHENYLEPHRINE HCL (PRESSORS) 10 MG/ML IV SOLN
INTRAVENOUS | Status: DC | PRN
Start: 2021-11-24 — End: 2021-11-24
  Administered 2021-11-24 (×2): 80 ug via INTRAVENOUS

## 2021-11-24 MED ORDER — SOD CITRATE-CITRIC ACID 500-334 MG/5ML PO SOLN
ORAL | Status: AC
  Filled 2021-11-24: qty 30

## 2021-11-24 MED ORDER — GABAPENTIN 100 MG PO CAPS
100.0000 mg | ORAL_CAPSULE | Freq: Three times a day (TID) | ORAL | Status: DC
  Administered 2021-11-24 – 2021-11-26 (×6): 100 mg via ORAL
  Filled 2021-11-24 (×7): qty 1

## 2021-11-24 MED ORDER — SIMETHICONE 80 MG PO CHEW: 80.0000 mg | CHEWABLE_TABLET | ORAL | Status: DC | PRN

## 2021-11-24 MED ORDER — ONDANSETRON HCL 4 MG/2ML IJ SOLN
INTRAMUSCULAR | Status: AC
  Filled 2021-11-24: qty 2

## 2021-11-24 MED ORDER — OXYCODONE HCL 5 MG PO TABS: 5.0000 mg | ORAL_TABLET | Freq: Once | ORAL | Status: DC | PRN

## 2021-11-24 MED ORDER — COCONUT OIL OIL: 1.0000 "application " | TOPICAL_OIL | Status: DC | PRN

## 2021-11-24 MED ORDER — MORPHINE SULFATE (PF) 0.5 MG/ML IJ SOLN
INTRAMUSCULAR | Status: DC | PRN
  Administered 2021-11-24: .15 mg via INTRATHECAL

## 2021-11-24 MED ORDER — DIPHENHYDRAMINE HCL 25 MG PO CAPS: 25.0000 mg | ORAL_CAPSULE | ORAL | Status: DC | PRN

## 2021-11-24 MED ORDER — MENTHOL 3 MG MT LOZG: 1.0000 | LOZENGE | OROMUCOSAL | Status: DC | PRN

## 2021-11-24 MED ORDER — PRENATAL MULTIVITAMIN CH
1.0000 | ORAL_TABLET | Freq: Every day | ORAL | Status: DC
  Administered 2021-11-25 – 2021-11-26 (×2): 1 via ORAL
  Filled 2021-11-24 (×2): qty 1

## 2021-11-24 MED ORDER — SCOPOLAMINE 1 MG/3DAYS TD PT72
1.0000 | MEDICATED_PATCH | Freq: Once | TRANSDERMAL | Status: DC
  Administered 2021-11-24: 1.5 mg via TRANSDERMAL

## 2021-11-24 MED ORDER — ACETAMINOPHEN 10 MG/ML IV SOLN: 1000.0000 mg | Freq: Once | INTRAVENOUS | Status: DC | PRN

## 2021-11-24 MED ORDER — OXYTOCIN-SODIUM CHLORIDE 30-0.9 UT/500ML-% IV SOLN
2.5000 [IU]/h | INTRAVENOUS | Status: AC
  Administered 2021-11-24: 2.5 [IU]/h via INTRAVENOUS
  Filled 2021-11-24: qty 500

## 2021-11-24 MED ORDER — PROMETHAZINE HCL 25 MG/ML IJ SOLN: 6.2500 mg | INTRAMUSCULAR | Status: DC | PRN

## 2021-11-24 MED ORDER — ACETAMINOPHEN 500 MG PO TABS: 1000.0000 mg | ORAL_TABLET | Freq: Four times a day (QID) | ORAL | Status: DC

## 2021-11-24 MED ORDER — MEPERIDINE HCL 25 MG/ML IJ SOLN: 6.2500 mg | INTRAMUSCULAR | Status: DC | PRN

## 2021-11-24 MED ORDER — FENTANYL CITRATE (PF) 100 MCG/2ML IJ SOLN: 25.0000 ug | INTRAMUSCULAR | Status: DC | PRN

## 2021-11-24 MED ORDER — OXYCODONE HCL 5 MG PO TABS: 5.0000 mg | ORAL_TABLET | ORAL | Status: DC | PRN

## 2021-11-24 MED ORDER — CEFAZOLIN IN SODIUM CHLORIDE 3-0.9 GM/100ML-% IV SOLN
3.0000 g | INTRAVENOUS | Status: AC
  Administered 2021-11-24: 3 g via INTRAVENOUS

## 2021-11-24 MED ORDER — OXYCODONE HCL 5 MG/5ML PO SOLN: 5.0000 mg | Freq: Once | ORAL | Status: DC | PRN

## 2021-11-24 MED ORDER — MORPHINE SULFATE (PF) 0.5 MG/ML IJ SOLN
INTRAMUSCULAR | Status: AC
  Filled 2021-11-24: qty 10

## 2021-11-24 MED ORDER — PHENYLEPHRINE HCL-NACL 20-0.9 MG/250ML-% IV SOLN
INTRAVENOUS | Status: AC
  Filled 2021-11-24: qty 250

## 2021-11-24 MED ORDER — POVIDONE-IODINE 10 % EX SWAB
2.0000 "application " | Freq: Once | CUTANEOUS | Status: AC
  Administered 2021-11-24: 2 via TOPICAL

## 2021-11-24 MED ORDER — NALOXONE HCL 0.4 MG/ML IJ SOLN: 0.4000 mg | INTRAMUSCULAR | Status: DC | PRN

## 2021-11-24 MED ORDER — ONDANSETRON HCL 4 MG/2ML IJ SOLN: 4.0000 mg | Freq: Three times a day (TID) | INTRAMUSCULAR | Status: DC | PRN

## 2021-11-24 MED ORDER — NALOXONE HCL 4 MG/10ML IJ SOLN
1.0000 ug/kg/h | INTRAVENOUS | Status: DC | PRN
  Filled 2021-11-24: qty 5

## 2021-11-24 MED ORDER — ACETAMINOPHEN 500 MG PO TABS
1000.0000 mg | ORAL_TABLET | Freq: Four times a day (QID) | ORAL | Status: DC
  Administered 2021-11-24 – 2021-11-26 (×8): 1000 mg via ORAL
  Filled 2021-11-24 (×8): qty 2

## 2021-11-24 MED ORDER — KETOROLAC TROMETHAMINE 30 MG/ML IJ SOLN
INTRAMUSCULAR | Status: AC
  Filled 2021-11-24: qty 1

## 2021-11-24 MED ORDER — DIPHENHYDRAMINE HCL 50 MG/ML IJ SOLN: 12.5000 mg | INTRAMUSCULAR | Status: DC | PRN

## 2021-11-24 MED ORDER — DIBUCAINE (PERIANAL) 1 % EX OINT: 1.0000 "application " | TOPICAL_OINTMENT | CUTANEOUS | Status: DC | PRN

## 2021-11-24 MED ORDER — ACETAMINOPHEN 10 MG/ML IV SOLN
INTRAVENOUS | Status: AC
  Filled 2021-11-24: qty 100

## 2021-11-24 MED ORDER — SOD CITRATE-CITRIC ACID 500-334 MG/5ML PO SOLN
30.0000 mL | Freq: Once | ORAL | Status: AC
  Administered 2021-11-24: 30 mL via ORAL

## 2021-11-24 MED ORDER — OXYTOCIN-SODIUM CHLORIDE 30-0.9 UT/500ML-% IV SOLN
INTRAVENOUS | Status: AC
  Filled 2021-11-24: qty 500

## 2021-11-24 MED ORDER — PHENYLEPHRINE HCL-NACL 20-0.9 MG/250ML-% IV SOLN
INTRAVENOUS | Status: DC | PRN
Start: 2021-11-24 — End: 2021-11-24
  Administered 2021-11-24: 30 ug/min via INTRAVENOUS

## 2021-11-24 MED ORDER — SODIUM CHLORIDE 0.9% FLUSH: 3.0000 mL | INTRAVENOUS | Status: DC | PRN

## 2021-11-24 MED ORDER — KETOROLAC TROMETHAMINE 30 MG/ML IJ SOLN
30.0000 mg | Freq: Four times a day (QID) | INTRAMUSCULAR | Status: AC
  Administered 2021-11-24 – 2021-11-25 (×4): 30 mg via INTRAVENOUS
  Filled 2021-11-24 (×4): qty 1

## 2021-11-24 MED ORDER — LEVOTHYROXINE SODIUM 50 MCG PO TABS
50.0000 ug | ORAL_TABLET | Freq: Every day | ORAL | Status: DC
  Administered 2021-11-25 – 2021-11-26 (×2): 50 ug via ORAL
  Filled 2021-11-24 (×2): qty 1

## 2021-11-24 MED ORDER — TETANUS-DIPHTH-ACELL PERTUSSIS 5-2.5-18.5 LF-MCG/0.5 IM SUSY: 0.5000 mL | PREFILLED_SYRINGE | Freq: Once | INTRAMUSCULAR | Status: DC

## 2021-11-24 MED ORDER — RHO D IMMUNE GLOBULIN 1500 UNIT/2ML IJ SOSY
300.0000 ug | PREFILLED_SYRINGE | Freq: Once | INTRAMUSCULAR | Status: AC
  Administered 2021-11-25: 300 ug via INTRAVENOUS
  Filled 2021-11-24: qty 2

## 2021-11-24 MED ORDER — FENTANYL CITRATE (PF) 100 MCG/2ML IJ SOLN
INTRAMUSCULAR | Status: AC
  Filled 2021-11-24: qty 2

## 2021-11-24 MED ORDER — SCOPOLAMINE 1 MG/3DAYS TD PT72
MEDICATED_PATCH | TRANSDERMAL | Status: AC
  Filled 2021-11-24: qty 1

## 2021-11-24 MED ORDER — ACETAMINOPHEN 10 MG/ML IV SOLN
INTRAVENOUS | Status: DC | PRN
  Administered 2021-11-24: 1000 mg via INTRAVENOUS

## 2021-11-24 MED ORDER — SIMETHICONE 80 MG PO CHEW
80.0000 mg | CHEWABLE_TABLET | Freq: Three times a day (TID) | ORAL | Status: DC
  Administered 2021-11-24 – 2021-11-26 (×5): 80 mg via ORAL
  Filled 2021-11-24 (×6): qty 1

## 2021-11-24 MED ORDER — IBUPROFEN 600 MG PO TABS
600.0000 mg | ORAL_TABLET | Freq: Four times a day (QID) | ORAL | Status: DC
  Administered 2021-11-25 – 2021-11-26 (×4): 600 mg via ORAL
  Filled 2021-11-24 (×4): qty 1

## 2021-11-24 MED ORDER — KETOROLAC TROMETHAMINE 30 MG/ML IJ SOLN: 30.0000 mg | Freq: Four times a day (QID) | INTRAMUSCULAR | Status: AC | PRN

## 2021-11-24 MED ORDER — AMISULPRIDE (ANTIEMETIC) 5 MG/2ML IV SOLN
10.0000 mg | Freq: Once | INTRAVENOUS | Status: DC | PRN
  Filled 2021-11-24: qty 4

## 2021-11-24 MED ORDER — SENNOSIDES-DOCUSATE SODIUM 8.6-50 MG PO TABS
2.0000 | ORAL_TABLET | ORAL | Status: DC
  Administered 2021-11-25 – 2021-11-26 (×2): 2 via ORAL
  Filled 2021-11-24 (×2): qty 2

## 2021-11-24 MED ORDER — DEXAMETHASONE SODIUM PHOSPHATE 4 MG/ML IJ SOLN
INTRAMUSCULAR | Status: DC | PRN
  Administered 2021-11-24: 4 mg via INTRAVENOUS

## 2021-11-24 SURGICAL SUPPLY — 39 items
BENZOIN TINCTURE PRP APPL 2/3 (GAUZE/BANDAGES/DRESSINGS) ×2 IMPLANT
CHLORAPREP W/TINT 26ML (MISCELLANEOUS) ×2 IMPLANT
CLAMP CORD UMBIL (MISCELLANEOUS) IMPLANT
CLIP FILSHIE TUBAL LIGA STRL (Clip) IMPLANT
CLOTH BEACON ORANGE TIMEOUT ST (SAFETY) ×2 IMPLANT
DRSG OPSITE POSTOP 4X10 (GAUZE/BANDAGES/DRESSINGS) ×2 IMPLANT
ELECT REM PT RETURN 9FT ADLT (ELECTROSURGICAL) ×2
ELECTRODE REM PT RTRN 9FT ADLT (ELECTROSURGICAL) ×1 IMPLANT
EXTRACTOR VACUUM KIWI (MISCELLANEOUS) IMPLANT
GAUZE SPONGE 4X4 12PLY STRL LF (GAUZE/BANDAGES/DRESSINGS) ×2 IMPLANT
GLOVE BIO SURGEON STRL SZ 6.5 (GLOVE) ×2 IMPLANT
GLOVE BIOGEL PI IND STRL 7.0 (GLOVE) ×2 IMPLANT
GLOVE BIOGEL PI INDICATOR 7.0 (GLOVE) ×2
GOWN STRL REUS W/TWL LRG LVL3 (GOWN DISPOSABLE) ×6 IMPLANT
KIT ABG SYR 3ML LUER SLIP (SYRINGE) IMPLANT
LIGASURE IMPACT 36 18CM CVD LR (INSTRUMENTS) ×1 IMPLANT
NDL HYPO 25X5/8 SAFETYGLIDE (NEEDLE) IMPLANT
NEEDLE HYPO 22GX1.5 SAFETY (NEEDLE) IMPLANT
NEEDLE HYPO 25X5/8 SAFETYGLIDE (NEEDLE) IMPLANT
NS IRRIG 1000ML POUR BTL (IV SOLUTION) ×2 IMPLANT
PACK C SECTION WH (CUSTOM PROCEDURE TRAY) ×2 IMPLANT
PAD ABD 7.5X8 STRL (GAUZE/BANDAGES/DRESSINGS) ×1 IMPLANT
PAD OB MATERNITY 4.3X12.25 (PERSONAL CARE ITEMS) ×2 IMPLANT
PENCIL SMOKE EVAC W/HOLSTER (ELECTROSURGICAL) ×2 IMPLANT
RTRCTR C-SECT PINK 25CM LRG (MISCELLANEOUS) ×2 IMPLANT
STRIP CLOSURE SKIN 1/2X4 (GAUZE/BANDAGES/DRESSINGS) ×2 IMPLANT
SUT CHROMIC 2 0 CT 1 (SUTURE) ×4 IMPLANT
SUT MNCRL 0 VIOLET CTX 36 (SUTURE) ×2 IMPLANT
SUT MONOCRYL 0 CTX 36 (SUTURE) ×2
SUT PDS AB 0 CTX 60 (SUTURE) ×2 IMPLANT
SUT PLAIN 2 0 (SUTURE)
SUT PLAIN ABS 2-0 CT1 27XMFL (SUTURE) IMPLANT
SUT VIC AB 0 CTX 36 (SUTURE) ×2
SUT VIC AB 0 CTX36XBRD ANBCTRL (SUTURE) ×2 IMPLANT
SUT VIC AB 4-0 KS 27 (SUTURE) ×2 IMPLANT
SYR CONTROL 10ML LL (SYRINGE) IMPLANT
TOWEL OR 17X24 6PK STRL BLUE (TOWEL DISPOSABLE) ×2 IMPLANT
TRAY FOLEY W/BAG SLVR 14FR LF (SET/KITS/TRAYS/PACK) ×2 IMPLANT
WATER STERILE IRR 1000ML POUR (IV SOLUTION) ×2 IMPLANT

## 2021-11-24 NOTE — Op Note (Signed)
Cesarean Section Procedure Note  [name]  DOB:    1986/06/14  MRN:    [id number]  Date of Surgery:  11/24/2021  Indication: 36 week 3 day Di/Di Twin IUP admitted for elective primary cesarean section for gestational hypertension and severe IUGR of baby B   Pre-operative Diagnosis:  1. [name] / [name]     2. Preterm at 36 weeks     3. Gestational Hypertension     4. Fetal Growth restriction  Post-operative Diagnosis: same  Procedure:  Primary cesarean delivery                         Surgeon: Wynonia Hazard, MD Assistants:  Oley Balm, CNM  Anesthesia: Spinal anesthesia  ASA Class: 2  Procedure Details   The patient was counseled about the risks, benefits, complications of the cesarean section. The patient concurred with the proposed plan, giving informed consent.  The site of surgery properly noted/marked. The patient was taken to Operating Room # A, identified as [name] and the procedure verified as C-Section Delivery. A Time Out was held and the above information confirmed.  After spinal was found to adequate , the patient was placed in the dorsal supine position with a leftward tilt, draped and prepped in the usual sterile manner. A Pfannenstiel incision was made and carried down through the subcutaneous tissue to the fascia.  The fascia was incised in the midline and the fascial incision was extended laterally with the bovie. The superior aspect of the fascial incision was grasped with Coker clamps x2, tented up and the rectus muscles dissected off sharply with Mayo scissors. The rectus was then dissected off with blunt dissection and Mayo scissors inferiorly. The rectus muscles were separated in the midline. The abdominal peritoneum was identified, and entered bluntly using surgeons fingers and the peritoneal opening was bluntly extended with gentle pulling.   The Alexis retractor was then deployed. The vesicouterine peritoneum was identified,  tented up, entered sharply with Metzenbaum scissors, and the bladder flap was created digitally. Scalpel was then used to make a low transverse incision on the uterus which was extended laterally with  blunt dissection. The membrane was ruptured of baby A  and the fetal vertex was identified, the head was brought out from the pelvis. The head was then delivered easily through the uterine incision followed by the body. The A live female infant was bulb suctioned on the operative field cried vigorously,  cord was double clamped and cut and the infant was passed to the waiting neonatologists. The membrane sac of the second baby was ruptured and the vertex identified and the head brought up and out followed by the body. A live female infant was bulb suctioned on the operative field.  She was noted to have a vigorous cry. The cord was double clamped and cut and the baby handed to the waiting Pediatrics team.  The placentas were then delivered spontaneously, intact and appear normal, to be sent to Pathology. The uterus was cleared of all clot and debris. The uterine incision was repaired with #0 Monocryl in running locked fashion. A second imbricating suture was performed using the same suture. The incision was hemostatic. Ovaries and tubes were inspected and normal.    The fallopian tube on the right side was located with the surgeon's fingers through the incision and the left fallopian tube was swept into view so that the midportion of the tube could  be grasped with Babcock clamps and delivered. As the loop was held up, the fallopian tube was traced down to the fimbriated end to ensure that this was the fallopian tube. From the fimbriated end of the tube the mesosalpinx was sequentially clamped, cauterized and transected with the Ligasure Impact. The tubal segment was handed off to be sent to Pathology. The ligated tubal stump was noted to be hemostatic. The process was repeated on the opposite side, grasping the  midportion of the left fallopian tube, visualizing the fimbriated end and sequentially clamping, cauterizing and cutting the tube off the uterus with the Ligasure Impact. TheThe Alexis retractor was removed. The abdominal cavity was cleared of all clot and debris. The abdominal peritoneum was reapproximated with 2-0 chromic  in a running fashion, the rectus muscles was reapproximated with #2 chromic in interrupted fashion. The fascia was closed with 0 looped PDS in a running fashion. The subcuticular layer was irrigated and all bleeders cauterized.     The incision was injected with 30 mL of 0.5% Marcaine. Interrupted sutures of 2-0 plain were used to re-approximate Scarpas fascia.  The skin was closed with 4-0 vicryl in a subcuticular fashion using a Mellody Dance needle. The incision was dressed with benzoine, steri strips, honeycomb and pressure dressing. All sponge lap and needle counts were correct x3. Patient tolerated the procedure well and recovered in stable condition following the procedure.   Instrument, sponge, and needle counts were correct prior the abdominal closure and at the conclusion of the case.    Findings: Live female infant, Apgars 8/9  "Mason" Live female infant Apgars 8/9  "Jacquenette Shone" clear amniotic fluid, placentas normal, 3VC on both,  normal uterus, bilateral tubes and ovaries  Estimated Blood Loss: 910 mL  IVF:  500  mL LR         Drains: Foley catheter  Urine output: 200 mL clear         Specimens: Placenta to Pathology; bilateral fallopian tubes         Implants: none         Complications:  None; patient tolerated the procedure well.         Disposition: PACU - hemodynamically stable.  Attending Attestation: I PERFORMED THE PROCEDURE AND MY ASSISTANT WAS NEEDED FOR THE COMPLEXITY OF THE CASE   Wynonia Hazard MD 11/24/2021 9:39 AM

## 2021-11-24 NOTE — Lactation Note (Signed)
This note was copied from a baby's chart. Lactation Consultation Note  Patient Name: [name] [id number] Date: 11/24/2021 Reason for consult: Initial assessment;Mother's request;Late-preterm 34-36.6wks;Infant < 6lbs;Multiple gestation;Maternal endocrine disorder (Lupus ( no medications)) Age:34 hours  Mom feeding plan is to pump and offer EBM with formula. Infant feeding well, 12-15 ml per feeding of 22 cal/oz Similac.   Mom set up on dEBP sized with 24 flanges. Mom aware with pumping flange size can change to adjust accordingly.  LTPI guidelines to reduce calorie loss reviewed including keeping total feeding under 30 min   Plan 1. To feed based on cues 8-12x 24 hr period.  2. Mom to offer EBM first followed by formula currently using extra slow flow nipple.  3. DEBP q 3hrs for 15 min  4. I and O sheet reviewed with mother by RN.  All questions answered at the end of the visit.    Maternal Data Has patient been taught Hand Expression?: Yes  Feeding Mother's Current Feeding Choice: Breast Milk and Formula Nipple Type: Slow - flow  LATCH Score                    Lactation Tools Discussed/Used Tools: Pump;Flanges Flange Size: 24 Breast pump type: Double-Electric Breast Pump Pump Education: Setup, frequency, and cleaning;Milk Storage Reason for Pumping: increase stimulation Pumping frequency: every 3 hrs for 15 min  Interventions Interventions: Breast feeding basics reviewed;Breast massage;Hand express;Expressed milk;DEBP;Education;LC Services brochure;Infant Driven Feeding Algorithm education  Discharge Pump: Personal  Consult Status Consult Status: Follow-up Date: 11/25/21 Follow-up type: In-patient    Katy Brickell  [name] 11/24/2021, 4:11 PM

## 2021-11-24 NOTE — Anesthesia Procedure Notes (Signed)
Spinal  Patient location during procedure: OR Start time: 11/24/2021 8:00 AM End time: 11/24/2021 8:05 AM Reason for block: surgical anesthesia Staffing Performed: anesthesiologist  Anesthesiologist: Mellody Dance, MD Preanesthetic Checklist Completed: patient identified, IV checked, risks and benefits discussed, surgical consent, monitors and equipment checked, pre-op evaluation and timeout performed Spinal Block Patient position: sitting Prep: DuraPrep and site prepped and draped Patient monitoring: continuous pulse ox, blood pressure and heart rate Approach: midline Location: L3-4 Injection technique: single-shot Needle Needle type: Pencan  Needle gauge: 24 G Needle length: 10 cm Assessment Events: CSF return Additional Notes Functioning IV was confirmed and monitors were applied. Sterile prep and drape, including hand hygiene and sterile gloves were used. The patient was positioned and the spine was prepped. The skin was anesthetized with lidocaine.  Free flow of clear CSF was obtained prior to injecting local anesthetic into the CSF. The needle was carefully withdrawn. The patient tolerated the procedure well.

## 2021-11-24 NOTE — Lactation Note (Signed)
This note was copied from a baby's chart. Lactation Consultation Note  Patient Name: [name] Date: 11/24/2021   Age:34 hours  LC attempted a visit, family in with RN at the time. LC to return later.   Maternal Data    Feeding Nipple Type: Slow - flow  LATCH Score                    Lactation Tools Discussed/Used    Interventions    Discharge    Consult Status      [name]  [name] 11/24/2021, 2:01 PM

## 2021-11-24 NOTE — Transfer of Care (Signed)
Immediate Anesthesia Transfer of Care Note  Patient: [name]  Procedure(s) Performed: CESAREAN SECTION MULTI-GESTATIONAL  Patient Location: PACU  Anesthesia Type:Spinal  Level of Consciousness: awake, alert  and oriented  Airway & Oxygen Therapy: Patient Spontanous Breathing  Post-op Assessment: Report given to RN and Post -op Vital signs reviewed and stable  Post vital signs: Reviewed and stable  Last Vitals:  Vitals Value Taken Time  BP 127/68 11/24/21 0925  Temp    Pulse 77 11/24/21 0929  Resp 18 11/24/21 0929  SpO2 100 % 11/24/21 0929  Vitals shown include unvalidated device data.  Last Pain:  Vitals:   11/24/21 0625  TempSrc: Oral         Complications: No notable events documented.

## 2021-11-24 NOTE — Lactation Note (Signed)
This note was copied from a baby's chart. Lactation Consultation Note  Patient Name: [name] OHYWV'P Date: 11/24/2021 Reason for consult: Initial assessment;Mother's request;Multiple gestation;Late-preterm 34-36.6wks;Infant < 6lbs;Breastfeeding assistance;Maternal discharge Age:34 hours Infant taking 10-18 ml per feeding. Volume supplementation guide provided.  All questions answered at the end of the visit.  Maternal Data    Feeding Mother's Current Feeding Choice: Breast Milk and Formula Nipple Type: Slow - flow  LATCH Score                    Lactation Tools Discussed/Used    Interventions    Discharge    Consult Status Consult Status: Follow-up Date: 11/25/21 Follow-up type: In-patient    Holly Mccay  Ward 11/24/2021, 4:16 PM

## 2021-11-24 NOTE — H&P (Signed)
H&P  36 y.o.G3P1011 comes in for a primary cesarean section of Mono/Di Twins at 36 weeks 3 days at MFM recommendation for severe IUGR of baby B. Patient has good fetal movement, no contractions, no leaking of fluid and no bleeding.   Prenatal Care at [facility] complicated by: Mono / Di Twins IUGR Twin B Gestational hypertension Lupus Hypothyroidism  Maternal obesity affecting pregnancy Abnormal NIPS   Past Medical History:  Diagnosis Date   Anxiety    Hypothyroidism    IBS (irritable bowel syndrome)    Lupus (HCC)    Post-dural puncture headache    received epidural blood patch   Thyroid disease    Vaginal Pap smear, abnormal    ASCUS HPV+    Past Surgical History:  Procedure Laterality Date   LEEP      OB History  Gravida Para Term Preterm AB Living  3 1 1   1 1   SAB IAB Ectopic Multiple Live Births  1       1    # Outcome Date GA Lbr Len/2nd Weight Sex Delivery Anes PTL Lv  3 Current           2 Term 11/21/07    F Vag-Spont   LIV  1 SAB             Social History   Socioeconomic History   Marital status: Married    Spouse name: Not on file   Number of children: Not on file   Years of education: Not on file   Highest education level: Not on file  Occupational History   Not on file  Tobacco Use   Smoking status: Former   Smokeless tobacco: Never  Vaping Use   Vaping Use: Never used  Substance and Sexual Activity   Alcohol use: Not Currently    Comment: occasionally   Drug use: No   Sexual activity: Yes    Partners: Male    Comment: mirena  Other Topics Concern   Not on file  Social History Narrative   Not on file   Social Determinants of Health   Financial Resource Strain: Not on file  Food Insecurity: Not on file  Transportation Needs: Not on file  Physical Activity: Not on file  Stress: Not on file  Social Connections: Not on file  Intimate Partner Violence: Not on file   Patient has no known allergies.      Prenatal Transfer Tool   Maternal Diabetes: No Genetic Screening: Abnormal:  Results: Other:Panorama unknown  Maternal Ultrasounds/Referrals: IUGR Fetal Ultrasounds or other Referrals:  Referred to Materal Fetal Medicine  Maternal Substance Abuse:  No Significant Maternal Medications:  Meds include: Syntroid Significant Maternal Lab Results: Rh negative  Physical Exam Vitals:   11/24/21 0625  BP: (!) 156/83  Pulse: 83  Resp: 18  Temp: 98.3 F (36.8 C)  SpO2: 100%     Lungs/Cor:  NAD Abdomen:  soft, gravid Ex:  no cords, erythema SVE:  NA FHTs:  A:150 B145   Assessment:  36 year old G3P1 at 22 w 3 days Mono / Di twins for primary cesarean section for severe IUGR of baby B and gestational hypertension.  Desires permanent sterilization.   Patient counseled that a cesarean section is a major surgery to deliver the baby through an incision in the abdominal wall and uterus.  She was counseled that this can be associated with risk and unforeseen complications.  These risks and complications of a cesarean section include,  but are not limited to:   -Infection of the uterus, pelvic organs, or skin. Infection is the most common complication of a Cesarean section.  -inadvertent injury to internal organs, such as bowel or bladder. If there is major injury, extensive surgery may be required. Bowel injury may require a colostomy for repair, bladder requiring prolonged catheterization, ureters requiring stent placement, or other pelvic/abdominal organs, the vessels or nerves.  -Blood loss possibly requiring a blood transfusion. Minor to moderate bleeding usually can be controlled.     Performing a hysterectomy for uncontrolled bleeding is rare.  -Blood clots can develop in the legs, pelvic organs, or lungs. -Possible wound breakdown or less than satisfactory healing of scar.  The patient voiced understanding of the procedure and its attendant risks, and gives informed consent to proceed.   Consent signed,  witnessed and placed into chart The patient was given the opportunity to ask questions, and her concerns were adequately addressed.   Plan:  On call to OR Ancef 3 gM IV pre op Abdomen clipped Bicitra given Foley to be placed in OR (foley already in place) [employer]

## 2021-11-24 NOTE — Anesthesia Postprocedure Evaluation (Signed)
Anesthesia Post Note  Patient: [name]  Procedure(s) Performed: CESAREAN SECTION MULTI-GESTATIONAL     Patient location during evaluation: Mother Baby Anesthesia Type: Spinal Level of consciousness: oriented and awake and alert Pain management: pain level controlled Vital Signs Assessment: post-procedure vital signs reviewed and stable Respiratory status: spontaneous breathing and respiratory function stable Cardiovascular status: blood pressure returned to baseline and stable Postop Assessment: no headache, no backache, no apparent nausea or vomiting and able to ambulate Anesthetic complications: no   No notable events documented.  Last Vitals:  Vitals:   11/24/21 1130 11/24/21 1214  BP: (!) 153/83 (!) 146/78  Pulse: 79 81  Resp: 20   Temp: 36.9 C   SpO2: 100%     Last Pain:  Vitals:   11/24/21 1130  TempSrc: Oral   Pain Goal:                   Mellody Dance

## 2021-11-25 DIAGNOSIS — D62 Acute posthemorrhagic anemia: Secondary | ICD-10-CM | POA: Diagnosis not present

## 2021-11-25 DIAGNOSIS — O139 Gestational [pregnancy-induced] hypertension without significant proteinuria, unspecified trimester: Secondary | ICD-10-CM | POA: Diagnosis present

## 2021-11-25 DIAGNOSIS — Z98891 History of uterine scar from previous surgery: Secondary | ICD-10-CM

## 2021-11-25 DIAGNOSIS — Z8659 Personal history of other mental and behavioral disorders: Secondary | ICD-10-CM

## 2021-11-25 LAB — CBC WITH DIFFERENTIAL/PLATELET
Abs Immature Granulocytes: 0.06 10*3/uL (ref 0.00–0.07)
Basophils Absolute: 0 10*3/uL (ref 0.0–0.1)
Basophils Relative: 0 %
Eosinophils Absolute: 0.2 10*3/uL (ref 0.0–0.5)
Eosinophils Relative: 2 %
HCT: 27 % — ABNORMAL LOW (ref 36.0–46.0)
Hemoglobin: 9 g/dL — ABNORMAL LOW (ref 12.0–15.0)
Immature Granulocytes: 1 %
Lymphocytes Relative: 16 %
Lymphs Abs: 1.7 10*3/uL (ref 0.7–4.0)
MCH: 29.9 pg (ref 26.0–34.0)
MCHC: 33.3 g/dL (ref 30.0–36.0)
MCV: 89.7 fL (ref 80.0–100.0)
Monocytes Absolute: 0.7 10*3/uL (ref 0.1–1.0)
Monocytes Relative: 7 %
Neutro Abs: 8 10*3/uL — ABNORMAL HIGH (ref 1.7–7.7)
Neutrophils Relative %: 74 %
Platelets: 135 10*3/uL — ABNORMAL LOW (ref 150–400)
RBC: 3.01 MIL/uL — ABNORMAL LOW (ref 3.87–5.11)
RDW: 15.1 % (ref 11.5–15.5)
WBC: 10.6 10*3/uL — ABNORMAL HIGH (ref 4.0–10.5)
nRBC: 0 % (ref 0.0–0.2)

## 2021-11-25 LAB — CBC
HCT: 28.1 % — ABNORMAL LOW (ref 36.0–46.0)
Hemoglobin: 9 g/dL — ABNORMAL LOW (ref 12.0–15.0)
MCH: 29.1 pg (ref 26.0–34.0)
MCHC: 32 g/dL (ref 30.0–36.0)
MCV: 90.9 fL (ref 80.0–100.0)
Platelets: 141 10*3/uL — ABNORMAL LOW (ref 150–400)
RBC: 3.09 MIL/uL — ABNORMAL LOW (ref 3.87–5.11)
RDW: 15.4 % (ref 11.5–15.5)
WBC: 11.4 10*3/uL — ABNORMAL HIGH (ref 4.0–10.5)
nRBC: 0 % (ref 0.0–0.2)

## 2021-11-25 LAB — COMPREHENSIVE METABOLIC PANEL
ALT: 11 U/L (ref 0–44)
AST: 20 U/L (ref 15–41)
Albumin: 1.9 g/dL — ABNORMAL LOW (ref 3.5–5.0)
Alkaline Phosphatase: 69 U/L (ref 38–126)
Anion gap: 6 (ref 5–15)
BUN: 12 mg/dL (ref 6–20)
CO2: 18 mmol/L — ABNORMAL LOW (ref 22–32)
Calcium: 8.6 mg/dL — ABNORMAL LOW (ref 8.9–10.3)
Chloride: 110 mmol/L (ref 98–111)
Creatinine, Ser: 0.81 mg/dL (ref 0.44–1.00)
GFR, Estimated: >60 mL/min (ref >60)
Glucose, Bld: 121 mg/dL — ABNORMAL HIGH (ref 70–99)
Potassium: 3.9 mmol/L (ref 3.5–5.1)
Sodium: 134 mmol/L — ABNORMAL LOW (ref 135–145)
Total Bilirubin: 0.2 mg/dL — ABNORMAL LOW (ref 0.3–1.2)
Total Protein: 4.9 g/dL — ABNORMAL LOW (ref 6.5–8.1)

## 2021-11-25 LAB — T4, FREE: Free T4: 0.46 ng/dL — ABNORMAL LOW (ref 0.61–1.12)

## 2021-11-25 LAB — TSH: TSH: 3.768 u[IU]/mL (ref 0.350–4.500)

## 2021-11-25 MED ORDER — POLYSACCHARIDE IRON COMPLEX 150 MG PO CAPS
150.0000 mg | ORAL_CAPSULE | Freq: Every day | ORAL | Status: DC
  Administered 2021-11-25 – 2021-11-26 (×2): 150 mg via ORAL
  Filled 2021-11-25 (×2): qty 1

## 2021-11-25 NOTE — Progress Notes (Signed)
[name] [id number] Postpartum Postoperative Day # 1  [name] [place], [id number], [redacted]w[redacted]d, S/P primary LT Cesarean Section due to Mono/Di twins with GHTN and twin B with IUGR. QBL was 910 with hgb drop of 11.3-9.0, asymptomatic on PO Iron. RH-, received rhogam today.  Pt was dx with GHTN, had elevated bp upon admission but normotensive PP, asymptomatic, no PCR done. H/O medication induce lupus, no s/sx upon admission. H/O anxiety/depression, no meds, mood stable. H/O hypothyroidism on 51mcg synthroid no labs done upon admission.    Patient Active Problem List   Diagnosis Date Noted   S/P cesarean section 11/25/2021   Acute blood loss anemia 11/25/2021   Gestational hypertension 11/25/2021   Normal postpartum course 11/25/2021   H/O: depression 11/25/2021   IUGR (intrauterine growth restriction) affecting care of mother 11/24/2021   Hypothyroidism due to Hashimoto's thyroiditis 09/17/2016   Hashimoto's thyroiditis 03/14/2016   Lupus ([place]) 03/14/2016   Smoker 03/14/2016     Active Ambulatory Problems    Diagnosis Date Noted   Hashimoto's thyroiditis 03/14/2016   Lupus ([place]) 03/14/2016   Smoker 03/14/2016   Hypothyroidism due to Hashimoto's thyroiditis 09/17/2016   Resolved Ambulatory Problems    Diagnosis Date Noted   No Resolved Ambulatory Problems   Past Medical History:  Diagnosis Date   Anxiety    Hypothyroidism    IBS (irritable bowel syndrome)    Post-dural puncture headache    Thyroid disease    Vaginal Pap smear, abnormal      Subjective: Patient up ad lib, denies syncope or dizziness. Reports consuming regular diet without issues and denies N/V. Patient reports 0 bowel movement + passing flatus.  Denies issues with urination and reports bleeding is "lighter."  Patient is breast and bottle feeding and reports going well. Had BTL during PCS for postpartum contraception.  Pain is being appropriately managed with use of po meds. Denies s/sx of anemia, lupus, preE, or  hypothyroidism,   Objective: Patient Vitals for the past 24 hrs:  BP Temp Temp src Pulse Resp SpO2  11/25/21 1400 126/62 97.6 F (36.4 C) -- 83 20 --  11/25/21 0554 109/61 97.6 F (36.4 C) Oral 83 18 100 %  11/25/21 0232 (!) 113/58 98.1 F (36.7 C) Oral 81 18 100 %  11/24/21 2200 109/60 98.8 F (37.1 C) Oral 96 18 100 %  11/24/21 1820 -- 98.1 F (36.7 C) Oral -- 18 --     Physical Exam:  General: alert, cooperative, and appears stated age Mood/Affect: Happy Lungs: clear to auscultation, no wheezes, rales or rhonchi, symmetric air entry.  Heart: normal rate, regular rhythm, normal S1, S2, no murmurs, rubs, clicks or gallops. Breast: breasts appear normal, no suspicious masses, no skin or nipple changes or axillary nodes. Abdomen:  + bowel sounds, soft, non-tender Incision: healing well, no significant drainage, no dehiscence, no significant erythema, Honeycomb dressing  Uterine Fundus: firm, involution -1 Lochia: appropriate Skin: Warm, Dry. DVT Evaluation: No evidence of DVT seen on physical exam. Negative Homan's sign. No cords or calf tenderness. 2+ pitting edema to bilat lower extremities.   Labs: Recent Labs    11/25/21 0454  HGB 9.0*  HCT 28.1*  WBC 11.4*    CBG (last 3)  No results for input(s): GLUCAP in the last 72 hours.   I/O: I/O last 3 completed shifts: In: 2918.4 [I.V.:2718.4; IV Piggyback:200] Out: 2141 [Urine:1150; Blood:991]   Assessment Postpartum Postoperative Day # 1  AHLEENA SONN, [id number], [redacted]w[redacted]d, S/P primary LT  Cesarean Section due to Mono/Di twins with GHTN and twin B with IUGR. QBL was 910 with hgb drop of 11.3-9.0, asymptomatic on PO Iron. RH-, received rhogam today.  Pt was dx with GHTN, had elevated bp upon admission but normotensive PP, asymptomatic, no PCR done. H/O medication induce lupus, no s/sx upon admission. H/O anxiety/depression, no meds, mood stable. H/O hypothyroidism on 35mcg synthroid no labs done upon admission. Pt  stable. -1 Involution. Breast and bottle Feeding. Hemodynamically Stable.  Plan: Continue other mgmt as ordered VTE Prophylactics: SCD, ambulated as tolerates.  Baby Female: Circ in morning at 0900 prior to being discharged.  Anemia: PO Iron.  Hypothyroidism: TSH and free T4 pending. Continue synthroid 74mcg if indicated H/O Depression and anxiety: No meds, monitor mood, 2 week PP check.  H/O Lupus: IN question, recommended rheumatology consult out pt if pot experience s/sx.  Pain control: Motrin/Tylenol/Narcotics PRN Education given regarding options for contraception, including  Plan for discharge tomorrow, Breastfeeding, Lactation consult, and Circumcision prior to discharge  Dr. Landry Mellow to be updated on patient status  [facility] NP-C, CNM 11/25/2021, 3:22 PM

## 2021-11-25 NOTE — Progress Notes (Signed)
MOB was referred for history of anxiety. °* Referral screened out by Clinical Social Worker because none of the following criteria appear to apply: °~ History of anxiety/depression during this pregnancy, or of post-partum depression following prior delivery. Per prenatal records review, no concerns noted. °~ Diagnosis of anxiety and/or depression within last 3 years. Per chart review, MOB's anxiety dates back to 2017.  °OR °* MOB's symptoms currently being treated with medication and/or therapy. °Please contact the Clinical Social Worker if needs arise, by MOB request, or if MOB scores greater than 9/yes to question 10 on Edinburgh Postpartum Depression Screen. ° °Kemoni Ortega, LCSW °Clinical Social Worker °[facility] °Cell#: [phone] °

## 2021-11-26 LAB — RH IG WORKUP (INCLUDES ABO/RH)
Fetal Screen: NEGATIVE
Gestational Age(Wks): 36.2
Unit division: 0

## 2021-11-26 MED ORDER — ACETAMINOPHEN 500 MG PO TABS: 1000.0000 mg | ORAL_TABLET | Freq: Four times a day (QID) | ORAL | 0 refills | Status: AC

## 2021-11-26 MED ORDER — IBUPROFEN 600 MG PO TABS: 600.0000 mg | ORAL_TABLET | Freq: Four times a day (QID) | ORAL | 0 refills | Status: AC

## 2021-11-26 NOTE — Discharge Summary (Signed)
OB Primary cesarean section Discharge Summary  Patient Name: [name] DOB: [birth year] MRN: [id number]  Date of admission: 11/24/2021 Intrauterine pregnancy: [redacted]w[redacted]d   Admitting diagnosis: IUGR (intrauterine growth restriction) affecting care of mother [O36.5990] Secondary diagnosis: Gestational Hypertension  JENNIE HANNAY, [id number], [redacted]w[redacted]d, S/P primary LT Cesarean Section due to Di/Di twins with GHTN and twin B with IUGR. QBL was 910 with hgb drop of 11.3-9.0, asymptomatic on PO Iron. RH-, received rhogam yesterday.  Pt was dx with GHTN, had elevated bp upon admission but normotensive PP, asymptomatic, no PCR done. H/O medication induce lupus, no s/sx upon admission. H/O anxiety/depression, no meds, mood stable. H/O hypothyroidism on synthroid.    Date of discharge: 11/26/2021    Discharge diagnosis: Preterm Pregnancy Delivered and Gestational Hypertension     Prenatal history: [id number]   EDC : 12/19/2021, by Last Menstrual Period  Prenatal care at [facility]  Prenatal course complicated by  Judi Cong / Judi Cong Twins IUGR Twin B Gestational hypertension Lupus - medicine induced Hypothyroidism - on Synthroid Maternal obesity affecting pregnancy Abnormal NIPS   Prenatal Labs: ABO, Rh: --/--/A NEG (02/01 0916) / Rhophylac given 11/25/2021 Antibody: NEG (02/01 0916) Rubella: Immune (07/25 0000)   RPR: NON REACTIVE (02/01 0906)  HBsAg: Negative (07/25 0000)  HIV: Non-reactive (07/25 0000)  GBS: Negative/-- (12/30 0000)                                    Hospital course:  Sceduled C/S   36 y.o. yo [id number] at [redacted]w[redacted]d was admitted to the hospital 11/24/2021 for scheduled cesarean section with the following indication:Multifetal Gestation.Delivery details are as follows:  Membrane Rupture Time/Date:    Sherica, Paternostro [[id number]]  8:23 AM    Loman Chroman [[id number]]  8:24 AM ,   Danaja, [place]ota [[id number]]  11/24/2021    Dorothe, Elmore [[id number]]  11/24/2021    Delivery Method:   Richardson Chiquito [[id number]]  C-Section, Low Transverse    Gisela, Lea [[id number]]  C-Section, Low Transverse  Details of operation can be found in separate operative note.  Patient had an uncomplicated postpartum course.  She is ambulating, tolerating a regular diet, passing flatus, and urinating well. Patient is discharged home in stable condition on  11/26/21        Newborn Data: Birth date:   Samhitha, Rosen [[id number]]  11/24/2021    Helana, Macbride [[id number]]  11/24/2021  Birth time:   Danea, Manter [[id number]]  8:24 AM    Loman Chroman [[id number]]  8:24 AM  Gender:   Nari, Vannatter [[id number]]  Female    Antonio, Woodhams [[id number]]  Female  Living status:   Hansika, Leaming [[id number]]  Living    Geniene, List [[id number]]  Living  Apgars:   Raelee, Rossmann [[id number]]  [address] [[id number]]  7 ,   Emmily, Pellegrin [[id number]]  [address] [place] [[id number]]  8  Weight:   Chrissy, Ealey [[id number]]  2700 g    Jerra, Huckeby [[id number]]  2390 g    Delivering PROVIDER:    Haizley, Cannella [[id number]]  Tami, Blass [[id number]]  Dover, Naoma Diener  Complications: None  Newborn Data:    Kristl, Morioka [[id number]]  Live born female  Birth Weight: 5 lb 15.2 oz (2700 g) APGAR: 8, 9  Newborn Delivery   Birth date/time: 11/24/2021 08:24:00 Delivery type: C-Section, Low Transverse Trial of labor: No C-section categorization: Primary       Drucella, Karbowski [[id number]]  Live born female  Birth Weight: 5 lb 4.3 oz (2390 g) APGAR: 7, 8  Newborn Delivery   Birth date/time: 11/24/2021 08:24:00 Delivery type: C-Section, Low Transverse Trial of labor: No C-section categorization: Primary      Baby Feeding: Formula and pumped milk bottle  feeding Disposition:home with mother  Post partum procedures: none  Labs: Lab Results  Component Value Date   WBC 10.6 (H) 11/25/2021   HGB 9.0 (L) 11/25/2021   HCT 27.0 (L) 11/25/2021   MCV 89.7 11/25/2021   PLT 135 (L) 11/25/2021   CMP Latest Ref Rng & Units 11/25/2021  Glucose 70 - 99 mg/dL 423(N)  BUN 6 - 20 mg/dL 12  Creatinine 3.61 - 4.43 mg/dL 1.54  Sodium 008 - 676 mmol/L 134(L)  Potassium 3.5 - 5.1 mmol/L 3.9  Chloride 98 - 111 mmol/L 110  CO2 22 - 32 mmol/L 18(L)  Calcium 8.9 - 10.3 mg/dL 1.9(J)  Total Protein 6.5 - 8.1 g/dL 4.9(L)  Total Bilirubin 0.3 - 1.2 mg/dL 0.9(T)  Alkaline Phos 38 - 126 U/L 69  AST 15 - 41 U/L 20  ALT 0 - 44 U/L 11    Physical Exam @ time of discharge:  Vitals:   11/25/21 0554 11/25/21 1400 11/25/21 2030 11/26/21 0556  BP: 109/61 126/62 130/67 127/82  Pulse: 83 83 97 89  Resp: 18 20 20 20   Temp: 97.6 F (36.4 C) 97.6 F (36.4 C) 98.6 F (37 C) 97.6 F (36.4 C)  TempSrc: Oral  Oral Oral  SpO2: 100%  100% 100%  Weight:      Height:       general: alert, cooperative, and no distress lochia: appropriate uterine fundus: firm perineum: intact incision: Dressing is clean, dry, and intact extremities: DVT Evaluation: No evidence of DVT seen on physical exam.  Discharge instructions:  "Baby and Me Booklet" and [facility] Booklet Discharge Medications:  Allergies as of 11/26/2021   No Known Allergies      Medication List     TAKE these medications    acetaminophen 500 MG tablet Commonly known as: TYLENOL Take 2 tablets (1,000 mg total) by mouth every 6 (six) hours.   cholecalciferol 25 MCG (1000 UNIT) tablet Commonly known as: VITAMIN D3 Take 1,000 Units by mouth daily.   ferrous sulfate 325 (65 FE) MG EC tablet Take 325 mg by mouth daily.   ibuprofen 600 MG tablet Commonly known as: ADVIL Take 1 tablet (600 mg total) by mouth every 6 (six) hours.   levothyroxine 50 MCG tablet Commonly known as: SYNTHROID Take 50  mcg by mouth daily before breakfast.   PRENATAL VITAMINS PO Take 1 tablet by mouth daily.       Diet: routine diet Activity: Advance as tolerated. Pelvic rest x 6 weeks.  Follow up:1 week  Signed: 01/24/2022 MSN, CNM 11/26/2021, 10:11 AM

## 2021-11-26 NOTE — Op Note (Deleted)
Procedure New born circumcision.  Informed consent obtained..local anesthetic with 1 cc of 1% lidocaine. Circumcision performed using usual sterile technique and 1.1 Gomco. Excellent Hemostasis and cosmesis noted. Pt tolerated the procedure well. 

## 2021-11-26 NOTE — Lactation Note (Signed)
This note was copied from a baby's chart. Lactation Consultation Note  Patient Name: Boy Heavan Francom [id number] Date: 11/26/2021 Reason for consult: Follow-up assessment;Late-preterm 34-36.6wks;Infant < 6lbs;1st time breastfeeding;Infant weight loss;Other (Comment) (Baby A - 6 % weight loss,receiving only bottles.  per mom has been pumping drops/ plan is to exlusively pump. LC reviewed supply+demand / importance of consistent pumping around the clock. LC reviewed BF D/C teaching. mom aware of her LC resources D/C.) Age:40 hours  LC reviewed the doc flow sheets with mom and dad and updated from their I/O sheet. LC made parents aware to work on getting babies to take at least 30 ml per feeding and gradually by 7 days should take at least 45-60 ml per feeding or greater per feeding.   Maternal Data    Feeding Mother's Current Feeding Choice: Breast Milk and Formula  LATCH Score                    Lactation Tools Discussed/Used Tools: Pump Flange Size: 24 Breast pump type: Double-Electric Breast Pump  Interventions    Discharge Discharge Education: Engorgement and breast care;Warning signs for feeding baby  Consult Status Consult Status: Complete Date: 11/26/21    Matilde Sprang Regan Llorente 11/26/2021, 11:02 AM

## 2021-11-27 LAB — SURGICAL PATHOLOGY

## 2021-12-05 ENCOUNTER — Telehealth (HOSPITAL_COMMUNITY): Payer: Self-pay | Admitting: *Deleted

## 2021-12-05 NOTE — Telephone Encounter (Signed)
Patient voiced no questions or concerns at this time. Has appointment scheduled with OB tomorrow for incision check. EPDS=8. Patient reported female infant has been spitting up more this week. Has plans to discuss concerns with pediatrician at appointment scheduled for 2/17. Patient voiced no other questions or concerns regarding infants at this time. Patient requested RN email information on hospital's virtual postpartum classes and support groups. Email sent. Deforest Hoyles, RN, 12/05/21, [phone]

## 2022-12-15 IMAGING — US US MFM OB FOLLOW-UP
1 series · 15 of 28 positions shown · non-contrast
Comparison: none

[Series 1: us mfm ob follow-up · 78 acquisitions, 15 frames shown]
[im 1/78]
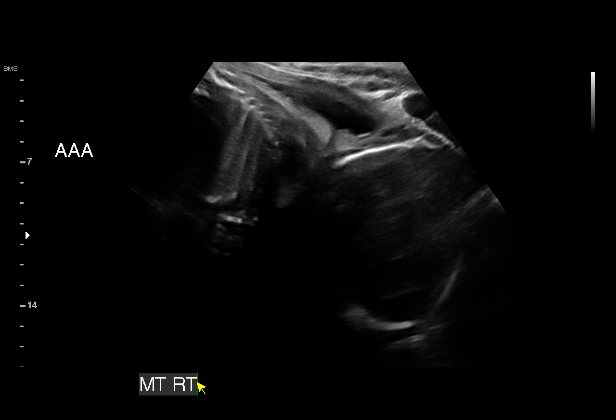
[im 6/78]
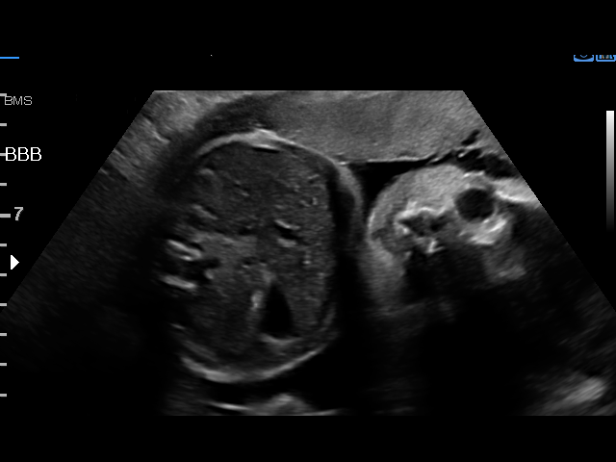
[im 12/78]
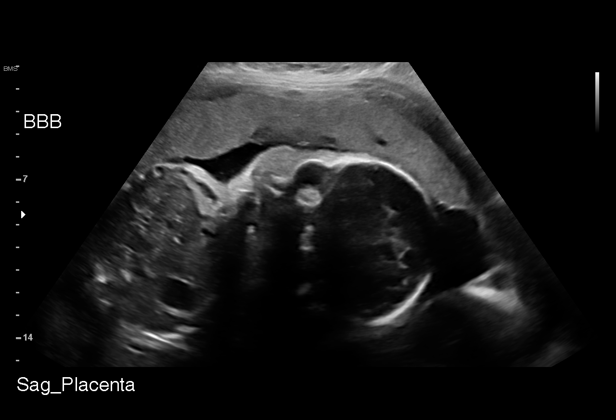
[im 18/78]
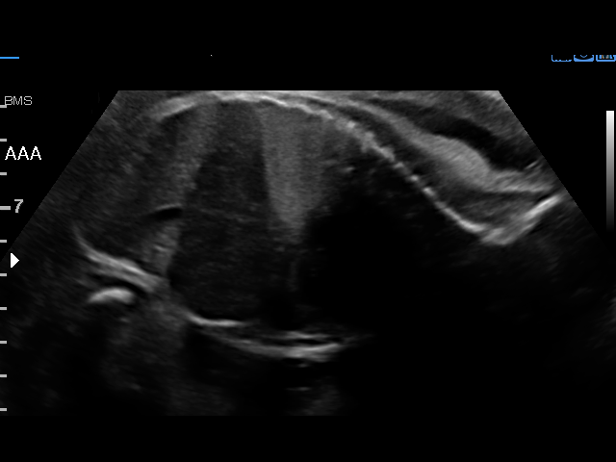
[im 23/78]
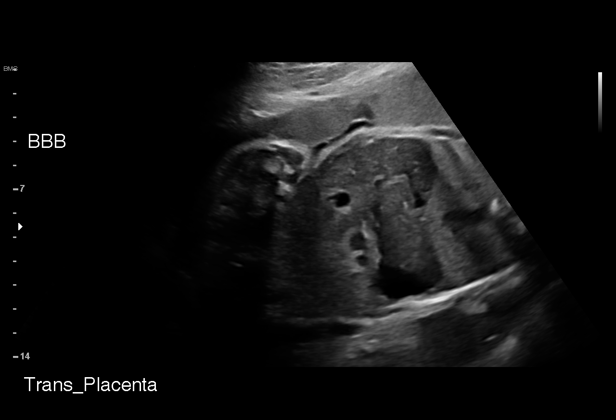
[im 29/78]
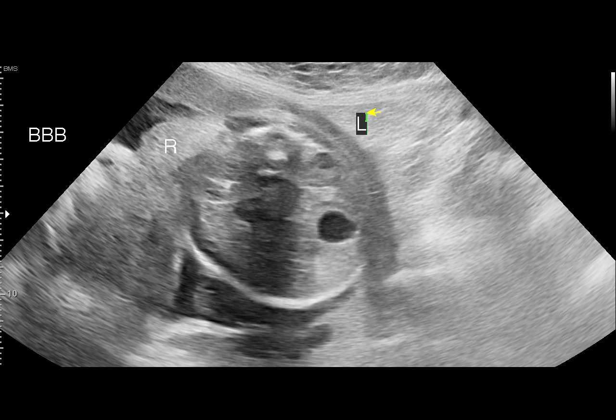
[im 35/78]
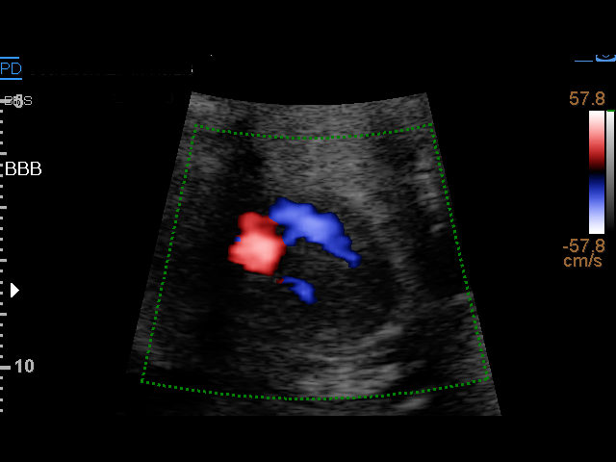
[im 40/78]
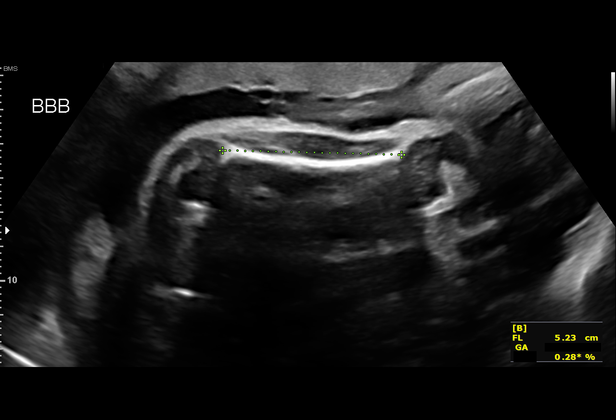
[im 43/78]
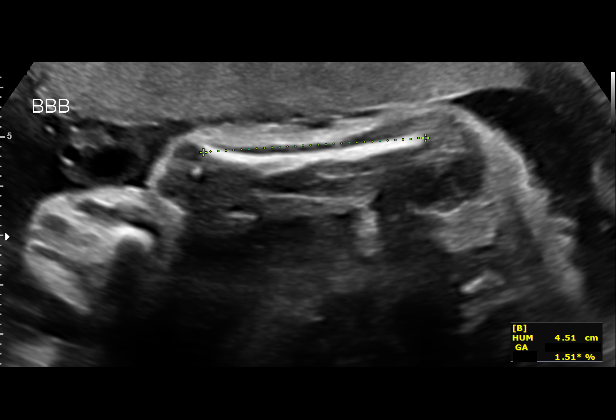
[im 49/78]
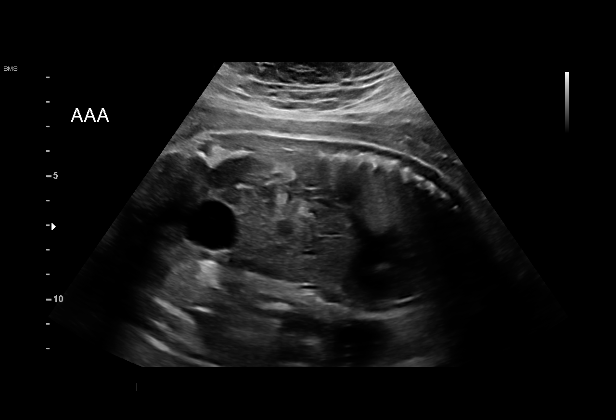
[im 55/78]
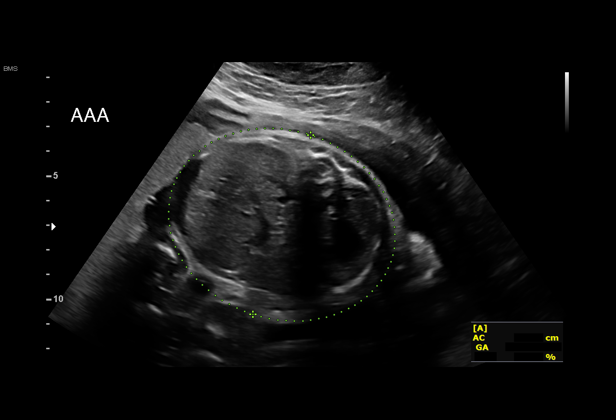
[im 60/78]
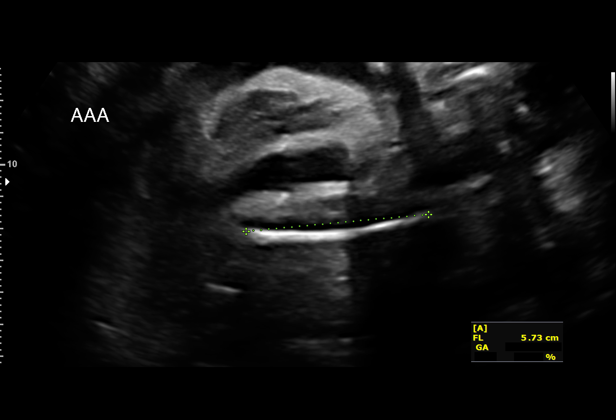
[im 66/78]
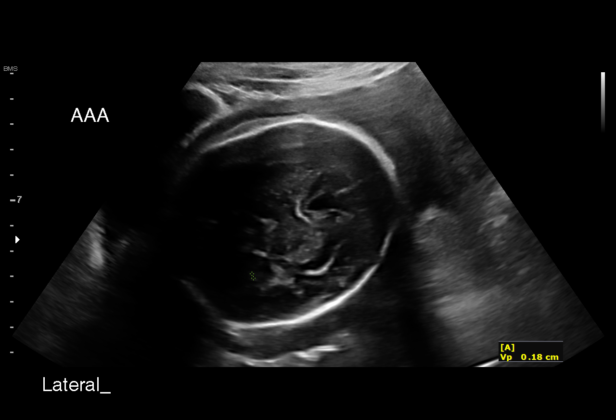
[im 72/78]
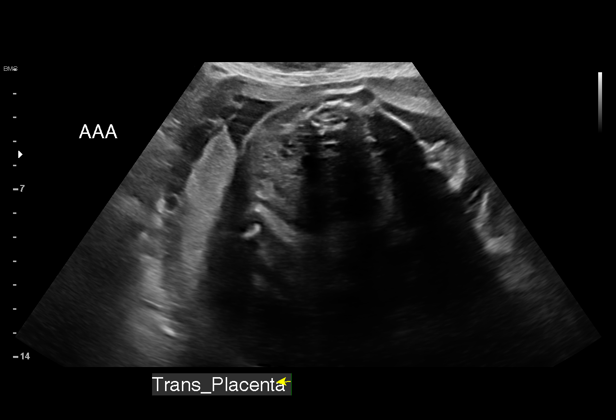
[im 78/78]
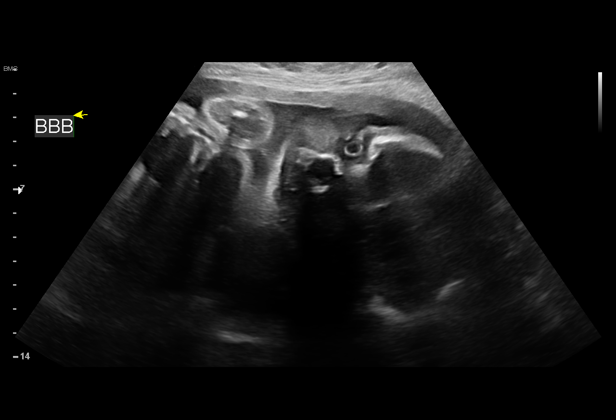

[15 of 28 positions shown; findings below may reference images not displayed]

Obstetrics &
                                                            Gynecology
                                                            [address]
                                                            [place].
                   CNM

    W/NONSTRESS
    GEST
    GEST RE EVAL
    W/NONSTRESS ADD'L GEST

Indications

 30 weeks gestation of pregnancy
 Twin pregnancy, di/di, third trimester
 Maternal care for known or suspected poor
 fetal growth, third trimester, fetus 2 IUGR
 Systemic lupus complicating pregnancy,         O26.893,
 third trimester
 Hypothyroid
 Previous cervical surgery- LEEP 9290
 Abnormal biochemical screen- abnormal
 panorama
Fetal Evaluation ([name])

 Num Of Fetuses:         2
 Fetal Heart Rate(bpm):  153
 Cardiac Activity:       Observed
 Fetal Lie:              Maternal right side
 Presentation:           Cephalic
 Placenta:               Posterior
 P. Cord Insertion:      Previously Visualized

 Amniotic Fluid
 AFI FV:      Within normal limits

                             Largest Pocket(cm)

Biophysical Evaluation ([name])

 Amniotic F.V:   Pocket => 2 cm             F. Tone:        Observed
 F. Movement:    Observed                   N.S.T:          Reactive
 F. Breathing:   Observed                   Score:          [DATE]
Biometry ([name])

 BPD:      79.5  mm     G. Age:  31w 6d         72  %    CI:        77.39   %    70 - 86
                                                         FL/HC:      19.9   %    19.3 -
 HC:      286.1  mm     G. Age:  31w 3d         29  %    HC/AC:      1.07        0.96 -
 AC:      268.1  mm     G. Age:  30w 6d         48  %    FL/BPD:     71.6   %    71 - 87
 FL:       56.9  mm     G. Age:  29w 6d         13  %    FL/AC:      21.2   %    20 - 24
 LV:        1.8  mm

 Est. FW:    6000  gm      3 lb 9 oz     33  %     FW Discordancy     0 \ 18 %
OB History

 Gravidity:    2
 Living:       1
Gestational Age ([name])

 LMP:           30w 6d        Date:  03/15/21                 EDD:   12/20/21
 U/S Today:     31w 0d                                        EDD:   12/19/21
 Best:          30w 6d     Det. By:  LMP  (03/15/21)          EDD:   12/20/21
Anatomy ([name])

 Cranium:               Appears normal         Aortic Arch:            Previously seen
 Cavum:                 Appears normal         Ductal Arch:            Previously seen
 Ventricles:            Appears normal         Diaphragm:              Appears normal
 Choroid Plexus:        Previously seen        Stomach:                Appears normal, left
                                                                       sided
 Cerebellum:            Previously seen        Abdomen:                Previously seen
 Posterior Fossa:       Previously seen        Abdominal Wall:         Previously seen
 Nuchal Fold:           Previously seen        Cord Vessels:           Appears normal (3
                                                                       vessel cord)
 Face:                  Orbits and profile     Kidneys:                Appear normal
                        previously seen
 Lips:                  Previously seen        Bladder:                Appears normal
 Thoracic:              Previously seen        Spine:                  Previously seen
 Heart:                 Previously seen        Upper Extremities:      Previously seen
 RVOT:                  Previously seen        Lower Extremities:      Previously seen
 LVOT:                  Previously seen

 Other:  Male gender previously seen. VC, 3VV and 3VTV previously
         visualized.
Doppler - Fetal Vessels ([name])

 Umbilical Artery
  S/D     %tile      RI    %tile      PI    %tile     PSV    ADFV    RDFV
                                                    (cm/s)
  2.73       48    0.63       49     0.9       43    19.91      No      No

Fetal Evaluation ([name])

 Num Of Fetuses:         2
 Fetal Heart Rate(bpm):  144
 Cardiac Activity:       Observed
 Fetal Lie:              Maternal left side
 Presentation:           Cephalic
 Placenta:               Anterior
 P. Cord Insertion:      Previously Visualized

 Amniotic Fluid
 AFI FV:      Within normal limits

                             Largest Pocket(cm)

Biophysical Evaluation ([name])

 Amniotic F.V:   Pocket => 2 cm             F. Tone:        Observed
 F. Movement:    Observed                   N.S.T:          Reactive
 F. Breathing:   Observed                   Score:          [DATE]
Biometry ([name])

 BPD:        71  mm     G. Age:  28w 4d        1.2  %    CI:        70.81   %    70 - 86
                                                         FL/HC:      19.0   %    19.3 -
 HC:      268.9  mm     G. Age:  29w 2d        1.1  %    HC/AC:      1.03        0.96 -
 AC:      260.1  mm     G. Age:  30w 1d         26  %    FL/BPD:     72.0   %    71 - 87
 FL:       51.1  mm     G. Age:  27w 3d        < 1  %    FL/AC:      19.6   %    20 - 24
 HUM:      45.1  mm     G. Age:  26w 5d        < 5  %

 LV:        3.8  mm

 Est. FW:    3246  gm    2 lb 15 oz     3.5  %     FW Discordancy        18  %
Gestational Age ([name])

 LMP:           30w 6d        Date:  03/15/21                 EDD:   12/20/21
 U/S Today:     28w 6d                                        EDD:   01/03/22
 Best:          30w 6d     Det. By:  LMP  (03/15/21)          EDD:   12/20/21
Anatomy ([name])
 Cranium:               Appears normal         Aortic Arch:            Previously seen
 Cavum:                 Appears normal         Ductal Arch:            Previously seen
 Ventricles:            Appears normal         Diaphragm:              Appears normal
 Choroid Plexus:        Previously seen        Stomach:                Appears normal, left
                                                                       sided
 Cerebellum:            Previously seen        Abdomen:                Previously seen
 Posterior Fossa:       Previously seen        Abdominal Wall:         Previously seen
 Nuchal Fold:           Previously seen        Cord Vessels:           Appears normal (3
                                                                       vessel cord)
 Face:                  Orbits and profile     Kidneys:                Appear normal
                        previously seen
 Lips:                  Previously seen        Bladder:                Appears normal
 Thoracic:              Previously seen        Spine:                  Previously seen
 Heart:                 Previously seen        Upper Extremities:      Previously seen
 RVOT:                  Previously seen        Lower Extremities:      Previously seen
 LVOT:                  Previously seen

 Other:  Female gender previously seen. VC seen, 3VV and 3VTV previously
         visualized.
Doppler - Fetal Vessels ([name])

 Umbilical Artery
  S/D     %tile      RI    %tile      PI    %tile     PSV    ADFV    RDFV
                                                    (cm/s)
  2.98       62    0.66       64    0.[REDACTED]      No      No

Cervix Uterus Adnexa

 Cervix
 Not visualized (advanced GA >80wks)

 Uterus
 No abnormality visualized.

 Right Ovary
 Within normal limits.

 Left Ovary
 Within normal limits.

 Cul De Sac
 No free fluid seen.

 Adnexa
 No abnormality visualized.
Impression

 Ms. Yasran has a dichorionic diamniotic twin pregnancy with a
 FGR in Twin B an EFW.

 Today's growth demonstrated a discordance of 18% with
 FGR in Twin B of 3.5% and Twin A of 33%

 Twin A and B both have a normal bladder, stomach and
 amniotic fluid volume the BPPs were [DATE] for Twin A an B
 as well.

 The UA Dopplers for Twin A & B was normal without
 evidence of AEDF or REDF.

 She is overall doing well without complaints.
 BP 128/63 mmHg.
Recommendations

 Continue weekly testing
 Repeat growth in 3
 Delivery recommended betwee 36-37 weeks.

## 2023-01-14 IMAGING — US US MFM UA CORD DOPPLER
1 series · 12 of 28 positions shown · non-contrast
Comparison: none

[Series 1: us mfm ua cord doppler · 50 acquisitions, 12 frames shown]
[im 2/50]
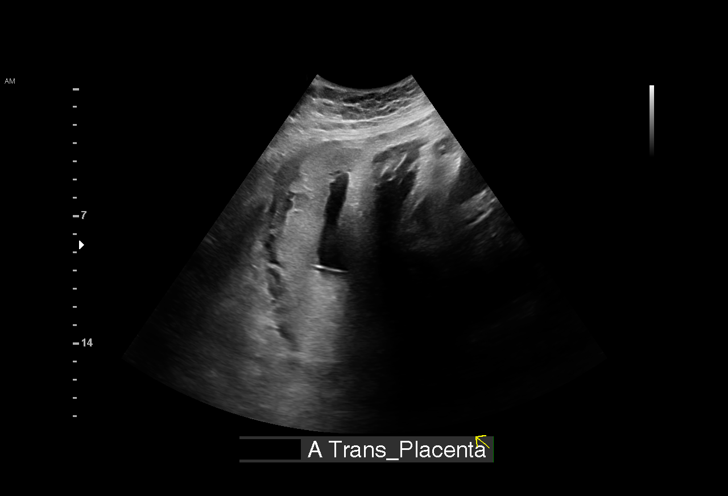
[im 6/50]
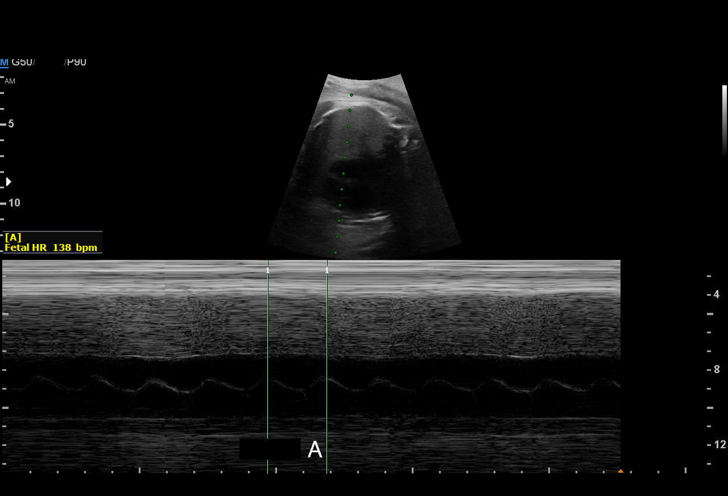
[im 10/50]
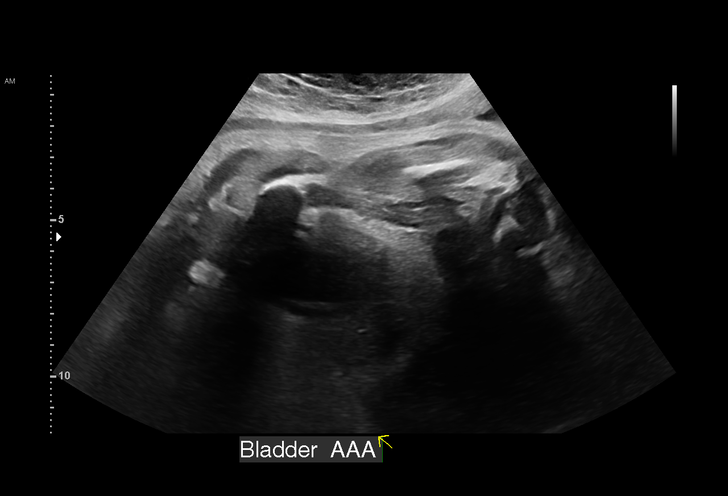
[im 15/50]
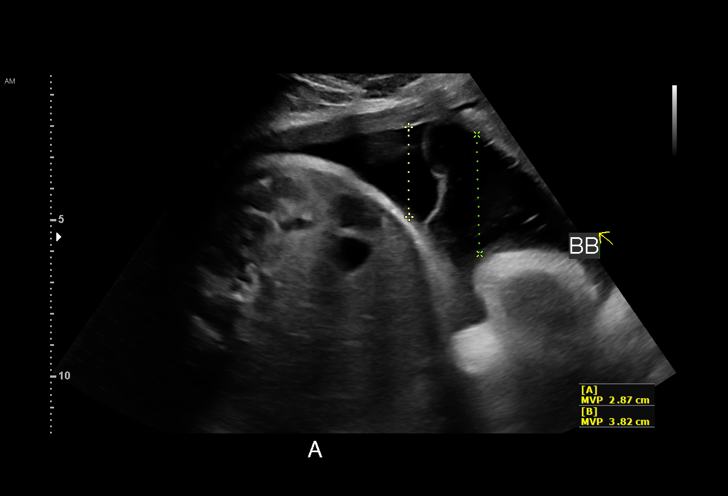
[im 19/50]
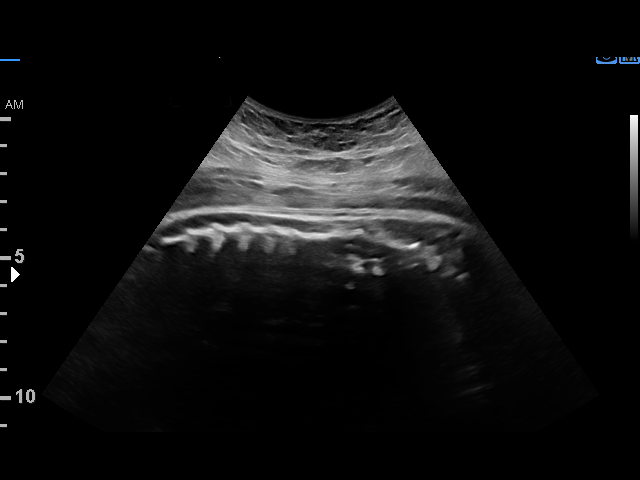
[im 22/50]
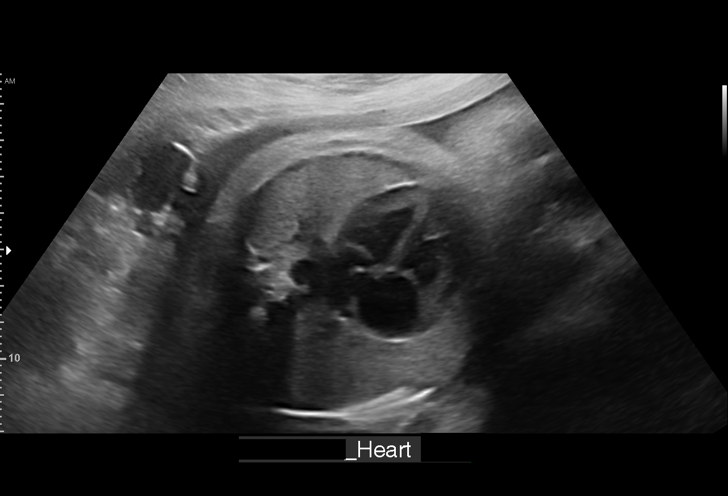
[im 28/50]
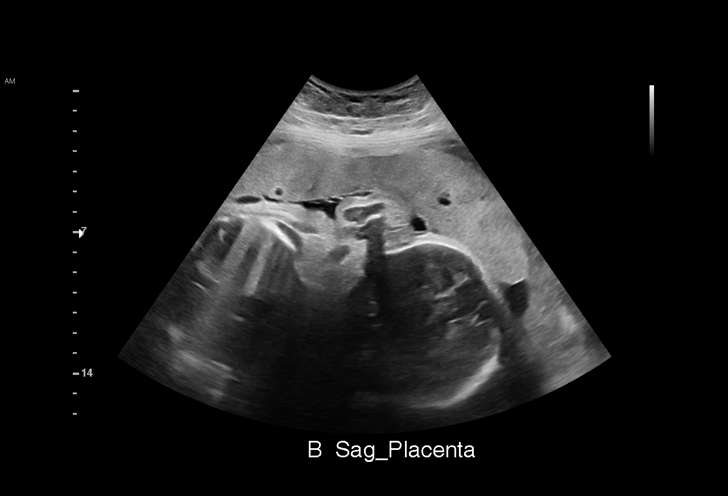
[im 31/50]
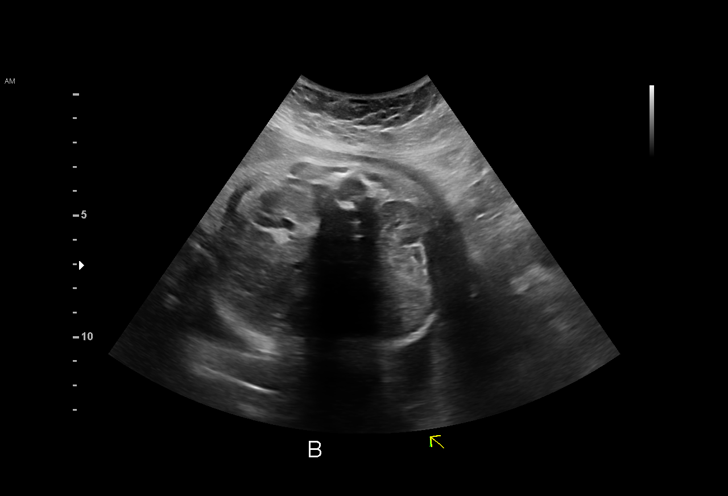
[im 35/50]
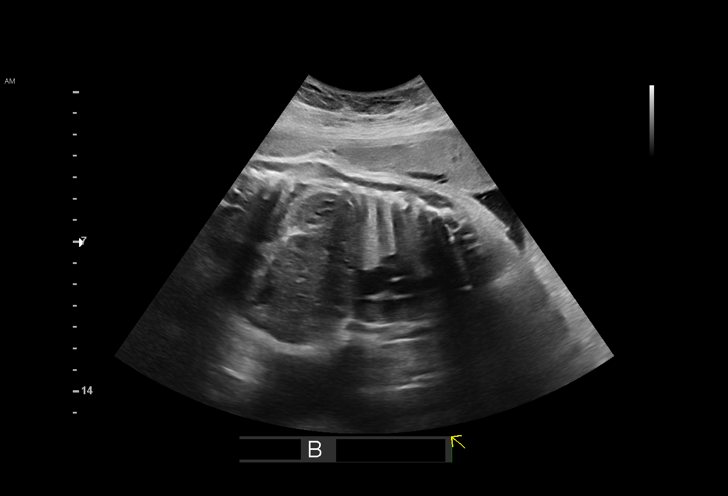
[im 40/50]
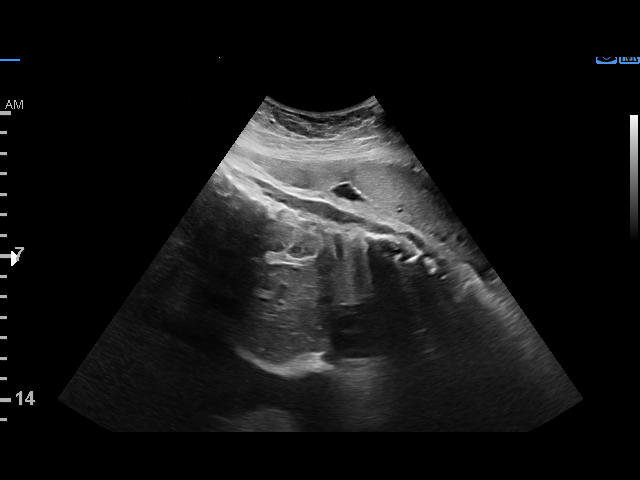
[im 44/50]
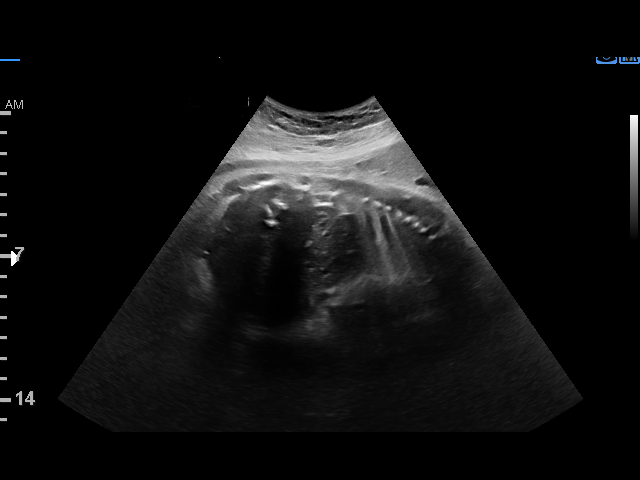
[im 48/50]
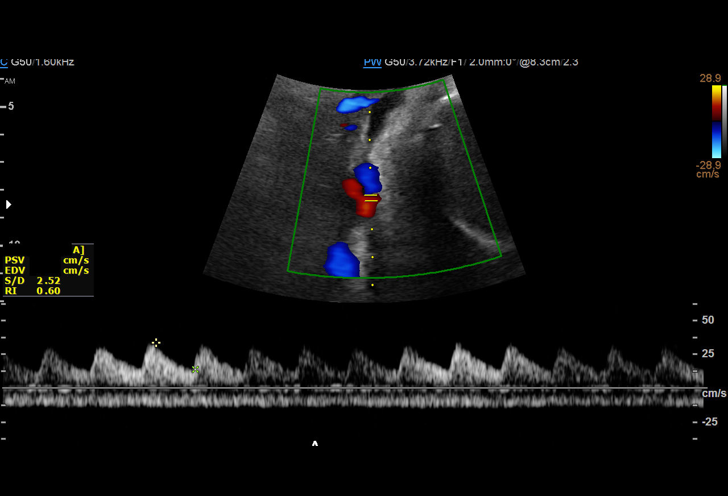

[12 of 28 positions shown; findings below may reference images not displayed]

Obstetrics &
                                                            Gynecology
                                                            [address]
                                                            [place].
                   CNM

                                                      [facility]
    TIGER/NONSTRESS                                       LETI
    TIGER/NONSTRESS TAKARA MINES                            LETI
 4  US MFM UA CORD DOPPLER                76820.02    C[facility]
                                                      [facility]TI

Indications

 Twin pregnancy, di/di, third trimester
 Maternal care for known or suspected poor
 fetal growth, third trimester, fetus 2 IUGR
 35 weeks gestation of pregnancy
 Systemic lupus complicating pregnancy,         O26.893,
 third trimester
 Hypothyroid (Synthroid)
 Previous cervical surgery- LEEP 3339
 Abnormal biochemical screen- abnormal
 panorama (atypical findings)
Fetal Evaluation ([name])
 Num Of Fetuses:         2
 Fetal Heart Rate(bpm):  138
 Cardiac Activity:       Observed
 Presentation:           Cephalic Maternal Right
 Placenta:               Posterior
 P. Cord Insertion:      Previously Visualized

 Amniotic Fluid
 AFI FV:      Within normal limits

                             Largest Pocket(cm)

Biophysical Evaluation ([name])

 Amniotic F.V:   Within normal limits       F. Tone:        Observed
 F. Movement:    Observed                   N.S.T:          Reactive
 F. Breathing:   Observed                   Score:          [DATE]
OB History

 Gravidity:    2
 Living:       1
Gestational Age ([name])

 LMP:           35w 1d        Date:  03/15/21                 EDD:   12/20/21
 Best:          35w 1d     Det. By:  LMP  (03/15/21)          EDD:   12/20/21
Anatomy ([name])

 Cranium:               Previously seen        Aortic Arch:            Previously seen
 Cavum:                 Previously seen        Ductal Arch:            Previously seen
 Ventricles:            Previously seen        Diaphragm:              Appears normal
 Choroid Plexus:        Previously seen        Stomach:                Appears normal, left
                                                                       sided
 Cerebellum:            Previously seen        Abdomen:                Previously seen
 Posterior Fossa:       Previously seen        Abdominal Wall:         Previously seen
 Nuchal Fold:           Previously seen        Cord Vessels:           Previously seen
 Face:                  Orbits and profile     Kidneys:                Appear normal
                        previously seen
 Lips:                  Previously seen        Bladder:                Appears normal
 Thoracic:              Previously seen        Spine:                  Previously seen
 Heart:                 Appears normal         Upper Extremities:      Previously seen
                        (4CH, axis, and
                        situs)
 RVOT:                  Previously seen        Lower Extremities:      Previously seen
 LVOT:                  Previously seen

 Other:  Male gender previously seen. VC, 3VV and 3VTV previously
         visualized.
Doppler - Fetal Vessels ([name])

 Umbilical Artery
  S/D     %tile      RI    %tile      PI    %tile            ADFV    RDFV
  2.27       37    0.56       41    0.72       24               No      No
Fetal Evaluation ([name])

 Num Of Fetuses:         2
 Fetal Heart Rate(bpm):  152
 Cardiac Activity:       Observed
 Presentation:           Cephalic Maternal LT
 Placenta:               Anterior
 P. Cord Insertion:      Previously Visualized

 Amniotic Fluid
 AFI FV:      Within normal limits

                             Largest Pocket(cm)

Biophysical Evaluation ([name])

 Amniotic F.V:   Within normal limits       F. Tone:        Observed
 F. Movement:    Observed                   N.S.T:          Reactive
 F. Breathing:   Observed                   Score:          [DATE]
Gestational Age ([name])

 LMP:           35w 1d        Date:  03/15/21                 EDD:   12/20/21
 Best:          35w 1d     Det. By:  LMP  (03/15/21)          EDD:   12/20/21
Anatomy ([name])

 Cranium:               Previously seen        Aortic Arch:            Previously seen
 Cavum:                 Previously seen        Ductal Arch:            Previously seen
 Ventricles:            Previously seen        Diaphragm:              Appears normal
 Choroid Plexus:        Previously seen        Stomach:                Appears normal, left
                                                                       sided
 Cerebellum:            Previously seen        Abdomen:                Previously seen
 Posterior Fossa:       Previously seen        Abdominal Wall:         Previously seen
 Nuchal Fold:           Previously seen        Cord Vessels:           Previously seen
 Face:                  Orbits and profile     Kidneys:                Appear normal
                        previously seen
 Lips:                  Previously seen        Bladder:                Appears normal
 Thoracic:              Previously seen        Spine:                  Previously seen
 Heart:                 Previously seen        Upper Extremities:      Previously seen
 RVOT:                  Previously seen        Lower Extremities:      Previously seen
 LVOT:                  Previously seen

 Other:  Female gender previously seen. VC seen, 3VV and 3VTV previously
         visualized.
Doppler - Fetal Vessels ([name])

 Umbilical Artery
  S/D     %tile      RI    %tile      PI    %tile            ADFV    RDFV
  2.78       70    0.64       75    1.02       82               No      No

Comments

 This patient was seen for a BPP/NST due to fetal growth
 restriction of twin B in a dichorionic, diamniotic twin gestation.
 She denies any problems since her last exam.  She reports
 feeling vigorous fetal movements of both fetuses throughout
 the day.

 A BPP was [DATE] for both twin A and twin B.  Both
 fetuses had a reactive NST today.

 There was normal amniotic fluid noted on today's ultrasound
 exam around both twin A and twin B.
 Doppler studies of the umbilical arteries performed due to
 fetal growth restriction showed a normal S/D ratio for both
 twin A and twin B.  There were no signs of absent or reversed
 end-diastolic flow noted today in either fetus.
 Due to IUGR of twin B in a dichorionic twin gestation, delivery
 should occur at between 36 to 37 weeks.

 The patient will return to our office next week for another BPP
 and umbilical artery Doppler studies.

 Her cesarean delivery can be scheduled at the end of next
 week.

## 2023-01-21 IMAGING — US US MFM FETAL BPP W/ NON-STRESS
1 series · 13 of 28 positions shown · non-contrast
Comparison: none

[Series 1: us mfm fetal bpp w/ non-stress · 51 acquisitions, 13 frames shown]
[im 2/51]
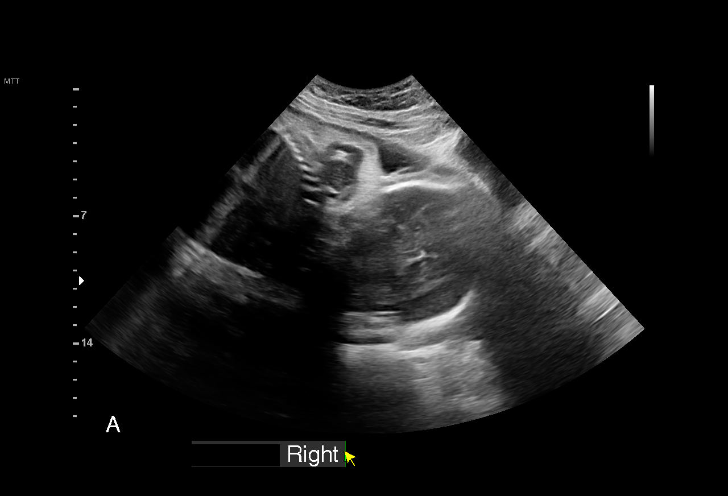
[im 6/51]
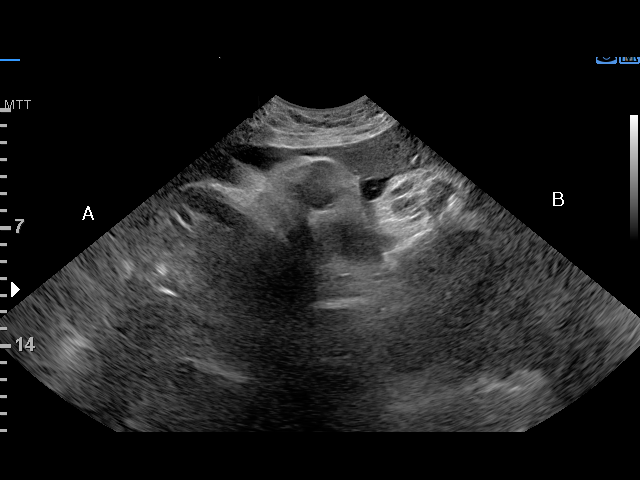
[im 10/51]
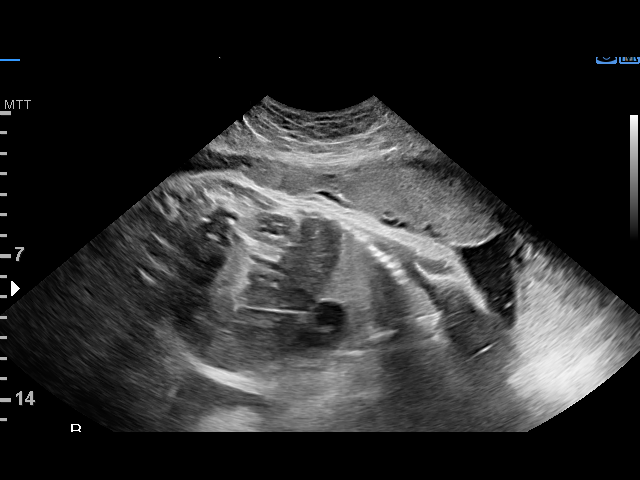
[im 13/51]
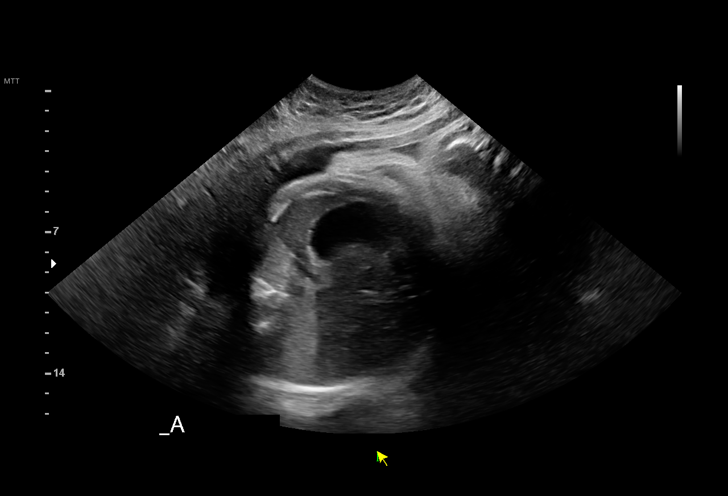
[im 17/51]
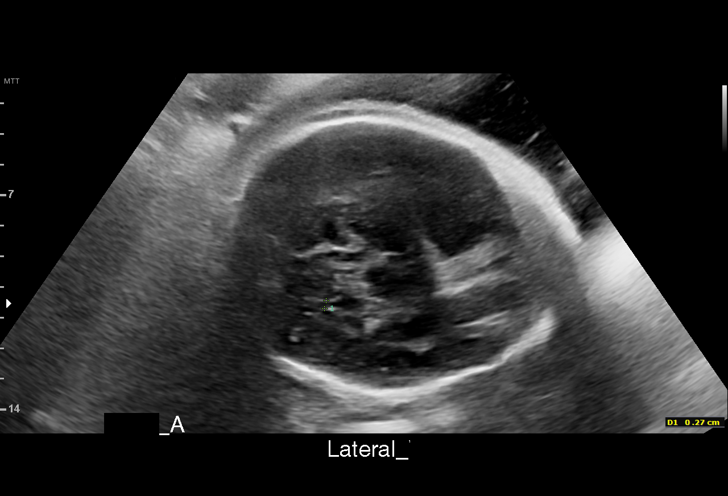
[im 21/51]
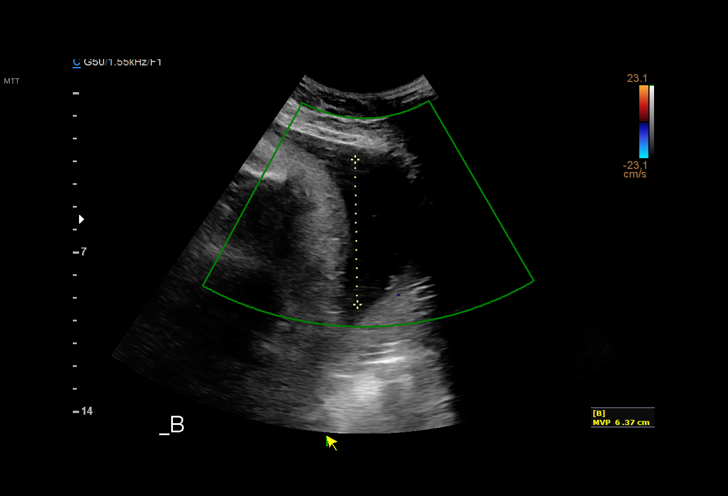
[im 26/51]
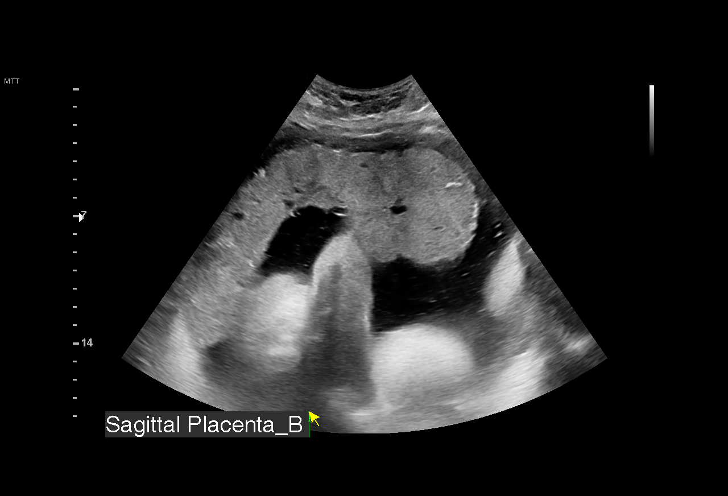
[im 30/51]
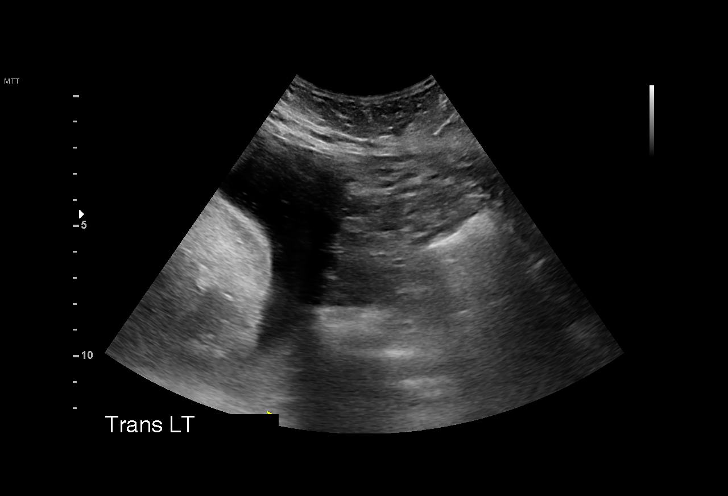
[im 34/51]
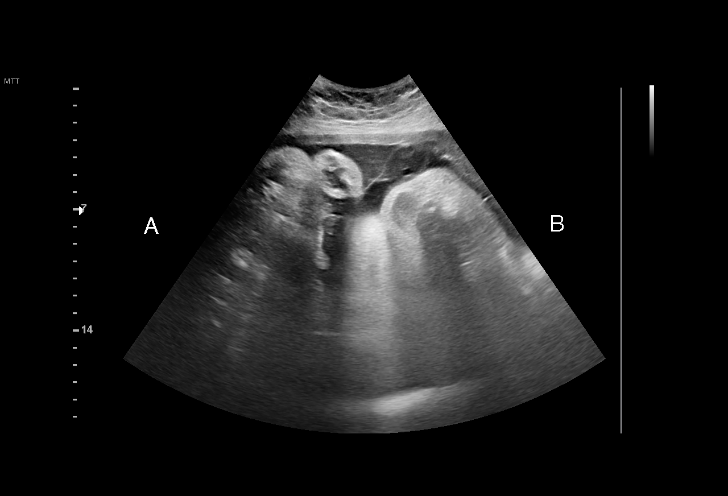
[im 38/51]
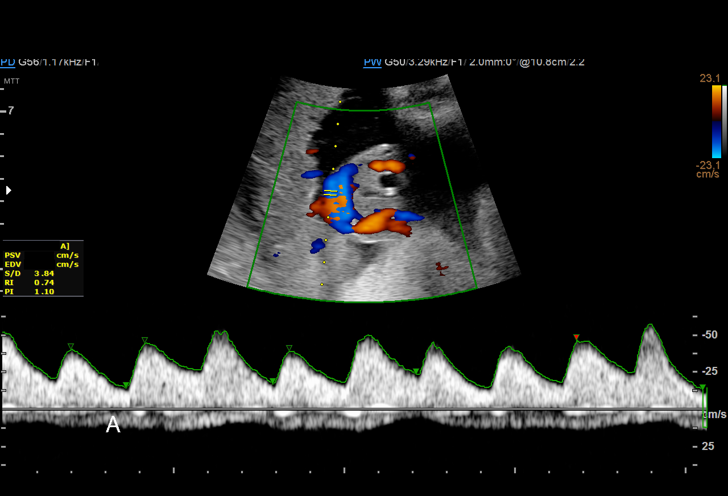
[im 41/51]
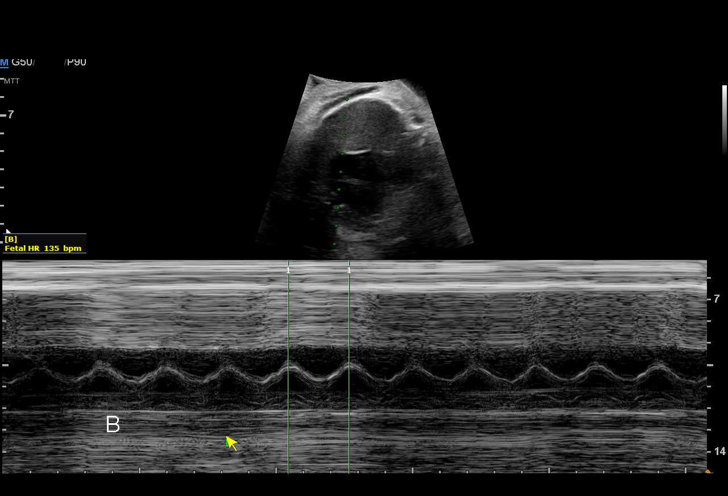
[im 45/51]
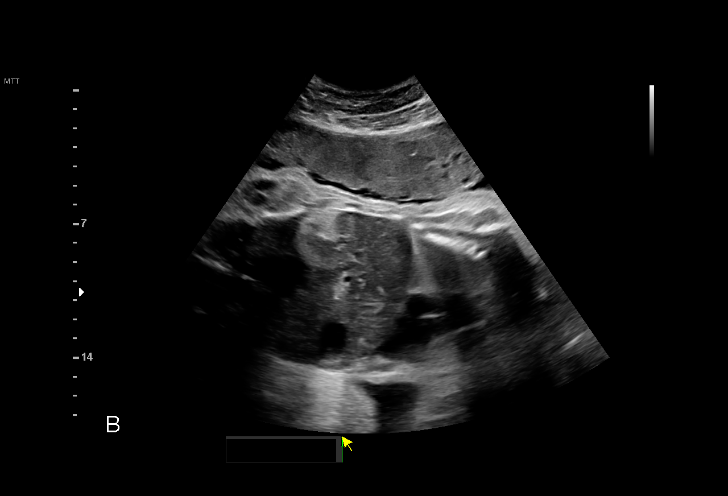
[im 49/51]
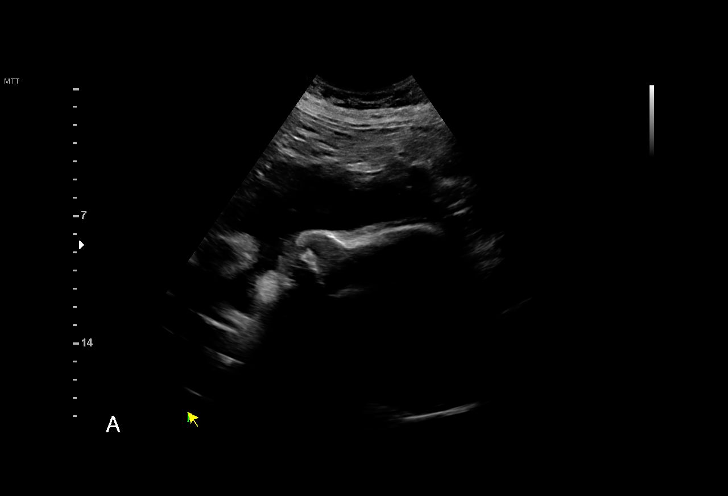

[13 of 28 positions shown; findings below may reference images not displayed]

Obstetrics &
                                                            Gynecology
                                                            [address]
                                                            [place].
                   CNM

    W/NONSTRESS                                       LESYA
 2  US MFM UA CORD DOPPLER                76820.02    YULISSA
                                                      LESYA
    AUJLA/NONSTRESS JOHN TETTEY MUNKO                            LESYA
                                                      LESYA

Indications

 36 weeks gestation of pregnancy
 Twin pregnancy, di/di, third trimester
 Maternal care for known or suspected poor
 fetal growth, third trimester, fetus 2 IUGR
 Systemic lupus complicating pregnancy,         O26.893,
 third trimester
 Hypothyroid (Synthroid)
 Previous cervical surgery- LEEP 6864
 Abnormal biochemical screen- abnormal
 panorama (atypical findings)
Fetal Evaluation ([name])
 Num Of Fetuses:         2
 Fetal Heart Rate(bpm):  150
 Cardiac Activity:       Observed
 Fetal Lie:              Maternal right side
 Presentation:           Cephalic
 Placenta:               Posterior
 P. Cord Insertion:      Previously Visualized

 Amniotic Fluid
 AFI FV:      Within normal limits

                             Largest Pocket(cm)
                             5
Biophysical Evaluation ([name])

 Amniotic F.V:   Pocket => 2 cm             F. Tone:        Observed
 F. Movement:    Observed                   N.S.T:          Reactive
 F. Breathing:   Observed                   Score:          [DATE]
OB History

 Gravidity:    2
 Living:       1
Gestational Age ([name])

 LMP:           36w 1d        Date:  03/15/21                 EDD:   12/20/21
 Best:          36w 1d     Det. By:  LMP  (03/15/21)          EDD:   12/20/21
Anatomy ([name])

 Ventricles:            Appears normal         Kidneys:                Appear normal
 Diaphragm:             Appears normal         Bladder:                Appears normal
 Stomach:               Appears normal, left
                        sided
Doppler - Fetal Vessels ([name])

 Umbilical Artery
  S/D     %tile      RI    %tile      PI    %tile     PSV    ADFV    RDFV
                                                    (cm/s)
  2.87       78    0.65       81    0.[REDACTED]      No      No

Fetal Evaluation ([name])

 Num Of Fetuses:         2
 Fetal Heart Rate(bpm):  135
 Cardiac Activity:       Observed
 Fetal Lie:              Maternal left side
 Presentation:           Cephalic
 Placenta:               Anterior
 P. Cord Insertion:      Previously Visualized
 Amniotic Fluid
 AFI FV:      Within normal limits

                             Largest Pocket(cm)

Biophysical Evaluation ([name])

 Amniotic F.V:   Pocket => 2 cm             F. Tone:        Observed
 F. Movement:    Observed                   N.S.T:          Reactive
 F. Breathing:   Observed                   Score:          [DATE]
Gestational Age ([name])

 LMP:           36w 1d        Date:  03/15/21                 EDD:   12/20/21
 Best:          36w 1d     Det. By:  LMP  (03/15/21)          EDD:   12/20/21
Anatomy ([name])

 Ventricles:            Appears normal         Kidneys:                Appear normal
 Diaphragm:             Appears normal         Bladder:                Appears normal
 Stomach:               Appears normal, left
                        sided
Doppler - Fetal Vessels ([name])

 Umbilical Artery
  S/D     %tile      RI    %tile      PI    %tile     PSV    ADFV    RDFV
                                                    (cm/s)
  2.81       76    0.64       78    0.[REDACTED]      No      No

Cervix Uterus Adnexa

 Cervix
 Not visualized (advanced GA >41wks)

 Uterus
 No abnormality visualized.

 Right Ovary
 Within normal limits.

 Left Ovary
 Within normal limits.

 Cul De Sac
 No free fluid seen.

 Adnexa
 No abnormality visualized.
Impression

 Monochorionic diamniotic twin gestation MFM Brief Note

 Ms. Montagne is here with DiDi twins for antental testing given
 FGR of twin B.

 She is seen at the request of Dr. Klever Jumper

 Antenatal testing for dichorionic diamnoitic twin pregnancy
 with IUGR in Twin B.
 Twin A normal stomach, amniotic fluid, bladder and BPP
 [DATE]- Maternal right, Cephalic
 Twin B normal stomach, amniotic fluid, bladder and BPP
 [DATE]- Maternal left, Cephalic

 The UA dopplers demonstrated forward flow without evidence
 of AEDF or REDF.

 Ms. Jim blood pressure was 144/64 and  141/93 mmHg.
 She reported having a headache on [REDACTED] that resolved
 with tyelenol. She has a mild headache this morning. She will
 take 500 mg of tyelenol today, if it doesn't resolve she will go
 to the [facility].

 She is scheduled for delivery tomorrow via cesarean delivery.

 I spent 15 mintues with > 50% in face to face consultation.

 I also discussed this plan of care with Dr. Klever Jumper.

## 2024-03-17 DIAGNOSIS — Z01419 Encounter for gynecological examination (general) (routine) without abnormal findings: Secondary | ICD-10-CM | POA: Diagnosis not present

## 2024-03-17 DIAGNOSIS — Z124 Encounter for screening for malignant neoplasm of cervix: Secondary | ICD-10-CM | POA: Diagnosis not present

## 2024-03-17 DIAGNOSIS — Z01411 Encounter for gynecological examination (general) (routine) with abnormal findings: Secondary | ICD-10-CM | POA: Diagnosis not present

## 2024-03-17 DIAGNOSIS — E079 Disorder of thyroid, unspecified: Secondary | ICD-10-CM | POA: Diagnosis not present

## 2024-03-17 DIAGNOSIS — Z113 Encounter for screening for infections with a predominantly sexual mode of transmission: Secondary | ICD-10-CM | POA: Diagnosis not present

## 2024-04-12 ENCOUNTER — Ambulatory Visit (INDEPENDENT_AMBULATORY_CARE_PROVIDER_SITE_OTHER)

## 2024-04-12 ENCOUNTER — Ambulatory Visit
Admission: RE | Admit: 2024-04-12 | Discharge: 2024-04-12 | Disposition: A | Discharge status: Home / Self Care | Payer: Self-pay | Source: Ambulatory Visit | Attending: Internal Medicine | Admitting: Internal Medicine

## 2024-04-12 ENCOUNTER — Ambulatory Visit: Payer: Self-pay | Admitting: Internal Medicine

## 2024-04-12 VITALS — BP 131/83 | HR 80 | Temp 98.5°F | Resp 18

## 2024-04-12 DIAGNOSIS — R051 Acute cough: Secondary | ICD-10-CM

## 2024-04-12 DIAGNOSIS — R0989 Other specified symptoms and signs involving the circulatory and respiratory systems: Secondary | ICD-10-CM | POA: Diagnosis not present

## 2024-04-12 DIAGNOSIS — J209 Acute bronchitis, unspecified: Secondary | ICD-10-CM

## 2024-04-12 DIAGNOSIS — Z87891 Personal history of nicotine dependence: Secondary | ICD-10-CM | POA: Diagnosis not present

## 2024-04-12 DIAGNOSIS — R059 Cough, unspecified: Secondary | ICD-10-CM | POA: Diagnosis not present

## 2024-04-12 LAB — POCT RAPID STREP A (OFFICE): Rapid Strep A Screen: NEGATIVE

## 2024-04-12 MED ORDER — ALBUTEROL SULFATE HFA 108 (90 BASE) MCG/ACT IN AERS: 2.0000 | INHALATION_SPRAY | Freq: Four times a day (QID) | RESPIRATORY_TRACT | 0 refills | Status: AC | PRN

## 2024-04-12 MED ORDER — PREDNISONE 20 MG PO TABS: 40.0000 mg | ORAL_TABLET | Freq: Every day | ORAL | 0 refills | Status: AC

## 2024-04-12 MED ORDER — PROMETHAZINE-DM 6.25-15 MG/5ML PO SYRP: 5.0000 mL | ORAL_SOLUTION | Freq: Every evening | ORAL | 0 refills | Status: AC | PRN

## 2024-04-12 NOTE — Discharge Instructions (Signed)
 You have bronchitis which is inflammation of the upper airways in your lungs due to a virus.   We will treat this with steroids. Take 2 pills of prednisone (40 mg) together once daily in the morning with breakfast for the next 5 days. Do not take any NSAIDs with steroid pills (no ibuprofen , naproxen while taking steroid, this could cause stomach upset).   Use albuterol  2 puffs every 4-6 hours as needed for cough, shortness of breath, and wheezing.   Promethazine  DM cough syrup at bedtime as needed for coughing at nighttime.  This will make you sleepy so only take at bedtime.  Use guaifenesin (plain mucinex) to break up congestion in nose/chest so that you are able to excrete easier. Drink plenty of fluids to stay well hydrated while taking mucinex so that it works well in the body.   If you develop any new or worsening symptoms or if your symptoms do not start to improve, please return here or follow-up with your primary care provider. If your symptoms are severe, please go to the emergency room.

## 2024-04-12 NOTE — ED Provider Notes (Signed)
 [facility]    CSN: [id number] Arrival date & time: 04/12/24  1046      History   Chief Complaint Chief Complaint  Patient presents with   Cough    Entered by patient    HPI [name] is a 38 y.o. female.   Patient presents to urgent care for evaluation of cough, nasal congestion, and generalized ill feeling that started 3 weeks ago.  Cough has become more frequent and prominent over the last several days.  Cough is mostly dry, nonproductive, and worse at nighttime.  Her children have been sick with similar symptoms recently.  Former smoker (quit 2 years ago), denies other drug use.  Denies history of chronic respiratory problems including asthma/COPD.  Reports intermittent nausea but attributes this to frequent coughing.  Denies vomiting, diarrhea, abdominal pain, rash, fever, chills, and recent antibiotic or steroid use in the last 90 days.  She is using multiple over-the-counter medications for symptoms with minimal relief.   Cough   Past Medical History:  Diagnosis Date   Anxiety    Hypothyroidism    IBS (irritable bowel syndrome)    Lupus    Post-dural puncture headache    received epidural blood patch   Thyroid  disease    Vaginal Pap smear, abnormal    ASCUS HPV+    Patient Active Problem List   Diagnosis Date Noted   S/P cesarean section 11/25/2021   Acute blood loss anemia 11/25/2021   Gestational hypertension 11/25/2021   Normal postpartum course 11/25/2021   H/O: depression 11/25/2021   IUGR (intrauterine growth restriction) affecting care of mother 11/24/2021   Hypothyroidism due to Hashimoto's thyroiditis 09/17/2016   Hashimoto's thyroiditis 03/14/2016   Lupus 03/14/2016   Smoker 03/14/2016    Past Surgical History:  Procedure Laterality Date   CESAREAN SECTION MULTI-GESTATIONAL N/A 11/24/2021   Procedure: CESAREAN SECTION MULTI-GESTATIONAL;  Surgeon: Bettina Muskrat, MD;  Location: [facility];  Service: Obstetrics;  Laterality: N/A;   TWINS   LEEP      OB History     Gravida  3   Para  2   Term  1   Preterm  1   AB  1   Living  3      SAB  1   IAB      Ectopic      Multiple  1   Live Births  3            Home Medications    Prior to Admission medications   Medication Sig Start Date End Date Taking? Authorizing Provider  albuterol  (VENTOLIN  HFA) 108 (90 Base) MCG/ACT inhaler Inhale 2 puffs into the lungs every 6 (six) hours as needed for wheezing or shortness of breath. 04/12/24  Yes Yonathan Perrow, Dorna HERO, FNP  predniSONE (DELTASONE) 20 MG tablet Take 2 tablets (40 mg total) by mouth daily with breakfast for 5 days. 04/12/24 04/17/24 Yes StanhopeDorna HERO, FNP  promethazine -dextromethorphan (PROMETHAZINE -DM) 6.25-15 MG/5ML syrup Take 5 mLs by mouth at bedtime as needed for cough. 04/12/24  Yes Enedelia Dorna HERO, FNP  acetaminophen  (TYLENOL ) 500 MG tablet Take 2 tablets (1,000 mg total) by mouth every 6 (six) hours. 11/26/21   Slaughterbeck, Damien, CNM  cholecalciferol (VITAMIN D3) 25 MCG (1000 UNIT) tablet Take 1,000 Units by mouth daily.    [provider]  ferrous sulfate 325 (65 FE) MG EC tablet Take 325 mg by mouth daily.    [provider]  ibuprofen  (ADVIL )  600 MG tablet Take 1 tablet (600 mg total) by mouth every 6 (six) hours. 11/26/21   Slaughterbeck, Damien, CNM  levothyroxine  (SYNTHROID ) 50 MCG tablet Take 50 mcg by mouth daily before breakfast.    [provider]  Prenatal Vit-Fe Fumarate-FA (PRENATAL VITAMINS PO) Take 1 tablet by mouth daily.    [provider]    Family History Family History  Problem Relation Age of Onset   Hypertension Father    Healthy Mother     Social History Social History   Tobacco Use   Smoking status: Former   Smokeless tobacco: Never  Advertising account planner   Vaping status: Never Used  Substance Use Topics   Alcohol use: Not Currently    Comment: occasionally   Drug use: No     Allergies   Patient has no known  allergies.   Review of Systems Review of Systems  Respiratory:  Positive for cough.   Per HPI   Physical Exam Triage Vital Signs ED Triage Vitals  Encounter Vitals Group     BP 04/12/24 1105 131/83     Girls Systolic BP Percentile --      Girls Diastolic BP Percentile --      Boys Systolic BP Percentile --      Boys Diastolic BP Percentile --      Pulse Rate 04/12/24 1105 80     Resp 04/12/24 1105 18     Temp 04/12/24 1105 98.5 F (36.9 C)     Temp Source 04/12/24 1105 Oral     SpO2 04/12/24 1105 98 %     Weight --      Height --      Head Circumference --      Peak Flow --      Pain Score 04/12/24 1106 5     Pain Loc --      Pain Education --      Exclude from Growth Chart --    No data found.  Updated Vital Signs BP 131/83 (BP Location: Right Arm)   Pulse 80   Temp 98.5 F (36.9 C) (Oral)   Resp 18   LMP 03/23/2024 (Approximate)   SpO2 98%   Visual Acuity Right Eye Distance:   Left Eye Distance:   Bilateral Distance:    Right Eye Near:   Left Eye Near:    Bilateral Near:     Physical Exam Vitals and nursing note reviewed.  Constitutional:      Appearance: She is not ill-appearing or toxic-appearing.  HENT:     Head: Normocephalic and atraumatic.     Right Ear: Hearing, tympanic membrane, ear canal and external ear normal.     Left Ear: Hearing, tympanic membrane, ear canal and external ear normal.     Nose: Congestion present.     Mouth/Throat:     Lips: Pink.     Mouth: Mucous membranes are moist. No injury or oral lesions.     Dentition: Normal dentition.     Tongue: No lesions.     Pharynx: Oropharynx is clear. Uvula midline. Posterior oropharyngeal erythema present. No pharyngeal swelling, oropharyngeal exudate, uvula swelling or postnasal drip.     Tonsils: No tonsillar exudate.   Eyes:     General: Lids are normal. Vision grossly intact. Gaze aligned appropriately.     Extraocular Movements: Extraocular movements intact.      Conjunctiva/sclera: Conjunctivae normal.   Neck:     Trachea: Trachea and phonation normal.  Cardiovascular:     Rate and Rhythm: Normal rate and regular rhythm.     Heart sounds: Normal heart sounds, S1 normal and S2 normal.  Pulmonary:     Effort: Pulmonary effort is normal. No respiratory distress.     Breath sounds: Normal breath sounds and air entry. No wheezing, rhonchi or rales.     Comments: Speaking in full sentences without difficulty.  Rhonchi heard with coughing on exam to the bilateral lower lung fields, clears slightly with cough. Chest:     Chest wall: No tenderness.   Musculoskeletal:     Cervical back: Neck supple.  Lymphadenopathy:     Cervical: No cervical adenopathy.   Skin:    General: Skin is warm and dry.     Capillary Refill: Capillary refill takes less than 2 seconds.     Findings: No rash.   Neurological:     General: No focal deficit present.     Mental Status: She is alert and oriented to person, place, and time. Mental status is at baseline.     Cranial Nerves: No dysarthria or facial asymmetry.   Psychiatric:        Mood and Affect: Mood normal.        Speech: Speech normal.        Behavior: Behavior normal.        Thought Content: Thought content normal.        Judgment: Judgment normal.      UC Treatments / Results  Labs (all labs ordered are listed, but only abnormal results are displayed) Labs Reviewed  POCT RAPID STREP A (OFFICE)    EKG   Radiology No results found.  Procedures Procedures (including critical care time)  Medications Ordered in UC Medications - No data to display  Initial Impression / Assessment and Plan / UC Course  I have reviewed the triage vital signs and the nursing notes.  Pertinent labs & imaging results that were available during my care of the patient were reviewed by me and considered in my medical decision making (see chart for details).   1.  Acute cough, acute bronchitis, former  smoker Evaluation suggests viral bronchitis, though given patient's history and risk for pneumonia, chest x-ray ordered. Chest x-ray negative for acute cardiopulmonary abnormality by my interpretation on wet read.  Staff will call if radiology reread shows any abnormalities requiring change in treatment plan.  Recommend treatment with steroid, bronchodilator, cough suppressants for symptomatic relief, and expectorants (mucinex) as needed- see AVS.   Counseled patient on potential for adverse effects with medications prescribed/recommended today, strict ER and return-to-clinic precautions discussed, patient verbalized understanding.    Final Clinical Impressions(s) / UC Diagnoses   Final diagnoses:  Acute cough  Acute bronchitis, unspecified organism  Former smoker     Discharge Instructions      You have bronchitis which is inflammation of the upper airways in your lungs due to a virus.   We will treat this with steroids. Take 2 pills of prednisone (40 mg) together once daily in the morning with breakfast for the next 5 days. Do not take any NSAIDs with steroid pills (no ibuprofen , naproxen while taking steroid, this could cause stomach upset).   Use albuterol  2 puffs every 4-6 hours as needed for cough, shortness of breath, and wheezing.   Promethazine  DM cough syrup at bedtime as needed for coughing at nighttime.  This will make you sleepy so only take at bedtime.  Use guaifenesin (plain mucinex)  to break up congestion in nose/chest so that you are able to excrete easier. Drink plenty of fluids to stay well hydrated while taking mucinex so that it works well in the body.   If you develop any new or worsening symptoms or if your symptoms do not start to improve, please return here or follow-up with your primary care provider. If your symptoms are severe, please go to the emergency room.      ED Prescriptions     Medication Sig Dispense Auth. Provider   predniSONE (DELTASONE)  20 MG tablet Take 2 tablets (40 mg total) by mouth daily with breakfast for 5 days. 10 tablet Enedelia Dorna HERO, FNP   albuterol  (VENTOLIN  HFA) 108 (90 Base) MCG/ACT inhaler Inhale 2 puffs into the lungs every 6 (six) hours as needed for wheezing or shortness of breath. 18 g Danie Diehl M, FNP   promethazine -dextromethorphan (PROMETHAZINE -DM) 6.25-15 MG/5ML syrup Take 5 mLs by mouth at bedtime as needed for cough. 118 mL Enedelia Dorna HERO, FNP      PDMP not reviewed this encounter.   Enedelia Dorna HERO, [place] 04/12/24 1136

## 2024-04-12 NOTE — ED Triage Notes (Signed)
 Pt reports she has a cough, throat pain, and bilateral ear pain x 3 weeks

## 2024-07-06 DIAGNOSIS — E063 Autoimmune thyroiditis: Secondary | ICD-10-CM | POA: Diagnosis not present

## 2024-07-06 DIAGNOSIS — M320 Drug-induced systemic lupus erythematosus: Secondary | ICD-10-CM | POA: Diagnosis not present

## 2024-07-06 DIAGNOSIS — R002 Palpitations: Secondary | ICD-10-CM | POA: Diagnosis not present

## 2024-07-06 DIAGNOSIS — Z0000 Encounter for general adult medical examination without abnormal findings: Secondary | ICD-10-CM | POA: Diagnosis not present

## 2024-07-06 DIAGNOSIS — Z862 Personal history of diseases of the blood and blood-forming organs and certain disorders involving the immune mechanism: Secondary | ICD-10-CM | POA: Diagnosis not present

## 2024-07-06 DIAGNOSIS — Z131 Encounter for screening for diabetes mellitus: Secondary | ICD-10-CM | POA: Diagnosis not present

## 2024-07-06 DIAGNOSIS — J309 Allergic rhinitis, unspecified: Secondary | ICD-10-CM | POA: Diagnosis not present

## 2024-07-07 ENCOUNTER — Other Ambulatory Visit: Payer: Self-pay

## 2024-07-07 DIAGNOSIS — E063 Autoimmune thyroiditis: Secondary | ICD-10-CM

## 2024-07-16 ENCOUNTER — Other Ambulatory Visit

## 2024-07-17 ENCOUNTER — Ambulatory Visit
Admission: RE | Admit: 2024-07-17 | Discharge: 2024-07-17 | Disposition: A | Discharge status: Home / Self Care | Source: Ambulatory Visit

## 2024-07-17 DIAGNOSIS — E063 Autoimmune thyroiditis: Secondary | ICD-10-CM

## 2024-08-05 DIAGNOSIS — F419 Anxiety disorder, unspecified: Secondary | ICD-10-CM | POA: Diagnosis not present

## 2024-08-05 DIAGNOSIS — F321 Major depressive disorder, single episode, moderate: Secondary | ICD-10-CM | POA: Diagnosis not present

## 2024-08-05 DIAGNOSIS — E069 Thyroiditis, unspecified: Secondary | ICD-10-CM | POA: Diagnosis not present

## 2024-10-02 DIAGNOSIS — F411 Generalized anxiety disorder: Secondary | ICD-10-CM | POA: Diagnosis not present
